# Patient Record
Sex: Male | Born: 1950
Health system: Southern US, Community
[De-identification: ages and names within clinical notes are randomized; demographics above are authoritative.]

## PROBLEM LIST (undated history)

## (undated) DIAGNOSIS — G4733 Obstructive sleep apnea (adult) (pediatric): Secondary | ICD-10-CM

## (undated) DIAGNOSIS — I10 Essential (primary) hypertension: Secondary | ICD-10-CM

## (undated) DIAGNOSIS — Z87440 Personal history of urinary (tract) infections: Secondary | ICD-10-CM

## (undated) DIAGNOSIS — Z87442 Personal history of urinary calculi: Secondary | ICD-10-CM

## (undated) DIAGNOSIS — K219 Gastro-esophageal reflux disease without esophagitis: Secondary | ICD-10-CM

## (undated) HISTORY — PX: OTHER SURGICAL HISTORY: SHX169

## (undated) HISTORY — DX: Obstructive sleep apnea (adult) (pediatric): G47.33

---

## 1999-06-04 ENCOUNTER — Encounter: Payer: Self-pay | Admitting: Emergency Medicine

## 1999-06-04 ENCOUNTER — Emergency Department (HOSPITAL_COMMUNITY): Admission: EM | Admit: 1999-06-04 | Discharge: 1999-06-04 | Payer: Self-pay | Admitting: Emergency Medicine

## 2012-08-20 DIAGNOSIS — K219 Gastro-esophageal reflux disease without esophagitis: Secondary | ICD-10-CM | POA: Diagnosis present

## 2013-02-22 DIAGNOSIS — N401 Enlarged prostate with lower urinary tract symptoms: Secondary | ICD-10-CM | POA: Diagnosis present

## 2016-12-09 ENCOUNTER — Other Ambulatory Visit: Payer: Self-pay | Admitting: Urology

## 2016-12-10 ENCOUNTER — Encounter (HOSPITAL_COMMUNITY): Payer: Self-pay | Admitting: *Deleted

## 2016-12-10 NOTE — Progress Notes (Signed)
Attempted to contact pt per telephone number of [phone]; pt not available at this number till after 7pm; gentleman answering phone gave second number of [phone]; no answer but LVMM with return call back number of [phone].

## 2016-12-12 ENCOUNTER — Ambulatory Visit (HOSPITAL_COMMUNITY)
Admission: RE | Admit: 2016-12-12 | Discharge: 2016-12-12 | Disposition: A | Discharge status: Home / Self Care | Payer: Commercial Managed Care - HMO | Source: Ambulatory Visit | Attending: Urology | Admitting: Urology

## 2016-12-12 ENCOUNTER — Ambulatory Visit (HOSPITAL_COMMUNITY): Payer: Commercial Managed Care - HMO

## 2016-12-12 ENCOUNTER — Encounter (HOSPITAL_COMMUNITY): Payer: Self-pay | Admitting: General Practice

## 2016-12-12 ENCOUNTER — Encounter (HOSPITAL_COMMUNITY)
Admission: RE | Disposition: A | Discharge status: Home / Self Care | Payer: Self-pay | Source: Ambulatory Visit | Attending: Urology

## 2016-12-12 DIAGNOSIS — Z7982 Long term (current) use of aspirin: Secondary | ICD-10-CM | POA: Insufficient documentation

## 2016-12-12 DIAGNOSIS — I10 Essential (primary) hypertension: Secondary | ICD-10-CM | POA: Insufficient documentation

## 2016-12-12 DIAGNOSIS — N201 Calculus of ureter: Secondary | ICD-10-CM | POA: Diagnosis present

## 2016-12-12 HISTORY — DX: Essential (primary) hypertension: I10

## 2016-12-12 HISTORY — DX: Gastro-esophageal reflux disease without esophagitis: K21.9

## 2016-12-12 HISTORY — PX: EXTRACORPOREAL SHOCK WAVE LITHOTRIPSY: SHX1557

## 2016-12-12 HISTORY — DX: Personal history of urinary calculi: Z87.442

## 2016-12-12 HISTORY — DX: Personal history of urinary (tract) infections: Z87.440

## 2016-12-12 SURGERY — LITHOTRIPSY, ESWL
Anesthesia: LOCAL | Laterality: Right

## 2016-12-12 MED ORDER — DIAZEPAM 5 MG PO TABS
10.0000 mg | ORAL_TABLET | ORAL | Status: AC
  Administered 2016-12-12: 10 mg via ORAL
  Filled 2016-12-12: qty 2

## 2016-12-12 MED ORDER — CIPROFLOXACIN HCL 500 MG PO TABS
500.0000 mg | ORAL_TABLET | ORAL | Status: AC
  Administered 2016-12-12: 500 mg via ORAL
  Filled 2016-12-12: qty 1

## 2016-12-12 MED ORDER — DIPHENHYDRAMINE HCL 25 MG PO CAPS
25.0000 mg | ORAL_CAPSULE | ORAL | Status: AC
  Administered 2016-12-12: 25 mg via ORAL
  Filled 2016-12-12: qty 1

## 2016-12-12 MED ORDER — SODIUM CHLORIDE 0.9 % IV SOLN
INTRAVENOUS | Status: DC
  Administered 2016-12-12: 07:00:00 via INTRAVENOUS

## 2016-12-12 MED FILL — OXYCODONE/APAP 5/325 MG TAB: 5-325 | 5 days supply | Qty: 30 | Fill #0

## 2016-12-12 MED FILL — TAMSULOSIN HCL 0.4 MG CAP: 0.4 | 30 days supply | Qty: 30 | Fill #0

## 2016-12-12 NOTE — Progress Notes (Deleted)
Dr. Alyson Ingles notified of pt. Taking aspirin on 12/09/16 at 2200 , Dr. Alyson Ingles reviewed  KUB. Procedure cancelled, Lithotripsy truck  notified, pt. To be rescheduled through the office. Pt. Notified when procedure rescheduled to make sure he does not take aspirin for 3 days prior. Pt. And pt's wife verbalized understanding.

## 2016-12-13 ENCOUNTER — Encounter (HOSPITAL_COMMUNITY): Payer: Self-pay | Admitting: Urology

## 2017-08-12 ENCOUNTER — Other Ambulatory Visit: Payer: Self-pay | Admitting: Urology

## 2017-08-12 DIAGNOSIS — R972 Elevated prostate specific antigen [PSA]: Secondary | ICD-10-CM

## 2017-08-26 ENCOUNTER — Other Ambulatory Visit: Payer: Commercial Managed Care - HMO

## 2017-09-15 ENCOUNTER — Ambulatory Visit
Admission: RE | Admit: 2017-09-15 | Discharge: 2017-09-15 | Disposition: A | Discharge status: Home / Self Care | Payer: Commercial Managed Care - HMO | Source: Ambulatory Visit | Attending: Urology | Admitting: Urology

## 2017-09-15 DIAGNOSIS — R972 Elevated prostate specific antigen [PSA]: Secondary | ICD-10-CM

## 2017-09-15 MED ORDER — GADOBENATE DIMEGLUMINE 529 MG/ML IV SOLN
15.0000 mL | Freq: Once | INTRAVENOUS | Status: AC | PRN
  Administered 2017-09-15: 15 mL via INTRAVENOUS

## 2017-11-16 ENCOUNTER — Ambulatory Visit (INDEPENDENT_AMBULATORY_CARE_PROVIDER_SITE_OTHER): Payer: Commercial Managed Care - HMO | Admitting: Ophthalmology

## 2017-11-16 ENCOUNTER — Encounter (INDEPENDENT_AMBULATORY_CARE_PROVIDER_SITE_OTHER): Payer: Self-pay | Admitting: Ophthalmology

## 2017-11-16 DIAGNOSIS — H353131 Nonexudative age-related macular degeneration, bilateral, early dry stage: Secondary | ICD-10-CM | POA: Diagnosis not present

## 2017-11-16 DIAGNOSIS — Z961 Presence of intraocular lens: Secondary | ICD-10-CM | POA: Diagnosis not present

## 2017-11-16 DIAGNOSIS — T8522XA Displacement of intraocular lens, initial encounter: Secondary | ICD-10-CM

## 2017-11-16 NOTE — Progress Notes (Signed)
[facility] Note  11/16/2017     CHIEF COMPLAINT Patient presents for Retina Evaluation and Blurred Vision   HISTORY OF PRESENT ILLNESS: Christopher Castillo is a 67 y.o. male who presents to the clinic today for:   HPI    Retina Evaluation    In right eye.  This started 1 day ago.  Duration of 1 day.  Associated Symptoms Negative for Flashes, Floaters, Redness, Photophobia and Trauma.  Treatments tried include no treatments.  I, the attending physician,  performed the HPI with the patient and updated documentation appropriately.          Blurred Vision    In right eye.  Onset was sudden.  Vision is blurred and hazy.  Severity is moderate.  This started 1 day ago.  Occurring constantly.  Since onset it is stable.  Associated symptoms include haloes.  Negative for eye pain, pain with eye movement, trauma and scalp tenderness.  Treatments tried include no treatments.  I, the attending physician,  performed the HPI with the patient and updated documentation appropriately.          Comments    1 day history of decreased vision OD. Pt is s/p phaco/PCIOL OU w/ Dr. Talbert Forest: OS on 2.4.19, OD on 2.25.19. Pt reports non standard intra-op course--?post capsule rupture w/ sulcus IOL placement. Pt reports normal course for first couple of days with improved vision, then noted on Saturday evening that OD was blurry w/ halos. States he is able to see gross objects but unable to make out any details. Denies eye pain, redness.  Currently taking: besivance TID OD, durezol TID OD, prolensa qdaily OD. Post op drops OS completed.       Last edited by Bernarda Caffey, MD on 11/16/2017  2:18 PM. (History)      Referring physician: Darleen Crocker, MD [address] [place], [place] [address]  HISTORICAL INFORMATION:   Selected notes from the [place]: No current outpatient medications on file. (Ophthalmic Drugs)   No current  facility-administered medications for this visit.  (Ophthalmic Drugs)   Current Outpatient Medications (Other)  Medication Sig  . aspirin EC 81 MG tablet Take 81 mg by mouth daily.  Marland Kitchen atenolol (TENORMIN) 50 MG tablet Take 50 mg by mouth daily.  . fenofibrate (TRICOR) 145 MG tablet Take 145 mg by mouth daily.  Marland Kitchen lisinopril (PRINIVIL,ZESTRIL) 40 MG tablet Take 40 mg by mouth daily.  Marland Kitchen omeprazole (PRILOSEC) 20 MG capsule Take 20 mg by mouth daily.   No current facility-administered medications for this visit.  (Other)      REVIEW OF SYSTEMS: ROS    Positive for: Cardiovascular, Eyes   Negative for: Constitutional, Gastrointestinal, Neurological, Skin, Genitourinary, Musculoskeletal, HENT, Endocrine, Respiratory, Psychiatric, Allergic/Imm, Heme/Lymph   Last edited by Bernarda Caffey, MD on 11/16/2017  2:18 PM. (History)       ALLERGIES No Known Allergies  PAST MEDICAL HISTORY Past Medical History:  Diagnosis Date  . GERD (gastroesophageal reflux disease)   . History of kidney stones   . History of urinary tract infection   . Hypertension    Past Surgical History:  Procedure Laterality Date  . colonscopy    . EXTRACORPOREAL SHOCK WAVE LITHOTRIPSY Right 12/12/2016   Procedure: RIGHT EXTRACORPOREAL SHOCK WAVE LITHOTRIPSY (ESWL);  Surgeon: Cleon Gustin, MD;  Location: [facility];  Service: Urology;  Laterality: Right;    FAMILY HISTORY History reviewed.  No pertinent family history.  SOCIAL HISTORY Social History   Tobacco Use  . Smoking status: Never Smoker  . Smokeless tobacco: Never Used  Substance Use Topics  . Alcohol use: No  . Drug use: No         OPHTHALMIC EXAM:  Base Eye Exam    Visual Acuity (Snellen - Linear)      Right Left   Dist [place] 20/200 +1 20/20 -1   Dist ph [place] 20/20 -2        Tonometry (Tonopen, 2:12 PM)      Right Left   Pressure 22 17       Pupils      Dark Light Shape React APD   Right 3.5 3 slightly irreg 2+ None   Left 3.5 3 Round  2+ None       Extraocular Movement      Right Left    Full Full       Neuro/Psych    Oriented x3:  Yes   Mood/Affect:  Normal       Dilation    Both eyes:  1.0% Mydriacyl, 2.5% Phenylephrine @ 2:18 PM        Slit Lamp and Fundus Exam    Slit Lamp Exam      Right Left   Lids/Lashes Normal Normal   Conjunctiva/Sclera White and quiet White and quiet   Cornea well healed temporal cataract wound with mild microcystic edema Clear   Anterior Chamber 2-3+ cell and pigment Deep and quiet   Iris temporal TID; slightly irregular temporal pupil margin Round and reactive   Lens 3 piece sulcus IOL-- shifted slightly nasal and nasal optic tilted anteriorly. PCIOL in good position   Vitreous syneresis syneresis       Fundus Exam      Right Left   Disc Normal Normal   C/D Ratio 0.55 0.5   Macula flat; pigment mottling and clumping flat; mild RPE mottling   Vessels Normal Normal   Periphery attached; cobblestoning; reticular degen and peripheral drusen attached; cobblestoning; reticular degen and peripheral drusen          IMAGING AND PROCEDURES  Imaging and Procedures for 11/16/17           ASSESSMENT/PLAN:    ICD-10-CM   1. Displacement of intraocular lens, initial encounter T85.22XA   2. Pseudophakia of both eyes Z96.1   3. Early dry stage nonexudative age-related macular degeneration of both eyes H35.3131     1. Tilted IOL OD  - s/p CE w/ 3-piece IOL in sulcus on 2.25.19 (Dr. Talbert Forest)  - IOL slightly shifted nasally and nasal optic tilted anteriorly, but not in AC  - VA 20/200, pinholes to 20/20 OD  - mild temporal TID and 2-3+ AC pigment and cell  - IOP okay at 22 OD  - discussed case with Dr. Talbert Forest on phone  - discussed findings, prognosis, and treatment options w/ patient  - 1 drop of pilocarpine 2% placed OD to constrict pupil back down  - pt to see Dr. Talbert Forest tomorrow at 4 pm   2. Pseudophakia OU  - s/p CE/IOL OU ([place])  - OS on 2.4.19  - OD on  2.25.19  3. Age related macular degeneration, non-exudative, both eyes  - The incidence, anatomy, and pathology of dry AMD, risk of progression, and the AREDS and AREDS 2 study including smoking risks discussed with patient.  - Recommend amsler grid monitoring   Ophthalmic Meds Ordered this  visit:  No orders of the defined types were placed in this encounter.      Return if symptoms worsen or fail to improve.  There are no Patient Instructions on file for this visit.   Explained the diagnoses, plan, and follow up with the patient and they expressed understanding.  Patient expressed understanding of the importance of proper follow up care.   Gardiner Sleeper, M.D., Ph.D. Diseases & Surgery of the Retina and [place] 11/16/17     Abbreviations: M myopia (nearsighted); A astigmatism; H hyperopia (farsighted); P presbyopia; Mrx spectacle prescription;  CTL contact lenses; OD right eye; OS left eye; OU both eyes  XT exotropia; ET esotropia; PEK punctate epithelial keratitis; PEE punctate epithelial erosions; DES dry eye syndrome; MGD meibomian gland dysfunction; ATs artificial tears; PFAT's preservative free artificial tears; [place] nuclear sclerotic cataract; PSC posterior subcapsular cataract; ERM epi-retinal membrane; PVD posterior vitreous detachment; RD retinal detachment; DM diabetes mellitus; DR diabetic retinopathy; NPDR non-proliferative diabetic retinopathy; PDR proliferative diabetic retinopathy; CSME clinically significant macular edema; DME diabetic macular edema; dbh dot blot hemorrhages; CWS cotton wool spot; POAG primary open angle glaucoma; C/D cup-to-disc ratio; HVF humphrey visual field; GVF goldmann visual field; OCT optical coherence tomography; IOP intraocular pressure; BRVO Branch retinal vein occlusion; CRVO central retinal vein occlusion; CRAO central retinal artery occlusion; BRAO branch retinal artery occlusion; RT retinal tear; SB  scleral buckle; PPV pars plana vitrectomy; VH Vitreous hemorrhage; PRP panretinal laser photocoagulation; IVK intravitreal kenalog; VMT vitreomacular traction; MH Macular hole;  NVD neovascularization of the disc; NVE neovascularization elsewhere; AREDS age related eye disease study; ARMD age related macular degeneration; POAG primary open angle glaucoma; EBMD epithelial/anterior basement membrane dystrophy; ACIOL anterior chamber intraocular lens; IOL intraocular lens; PCIOL posterior chamber intraocular lens; Phaco/IOL phacoemulsification with intraocular lens placement; [place] photorefractive keratectomy; LASIK laser assisted in situ keratomileusis; HTN hypertension; DM diabetes mellitus; COPD chronic obstructive pulmonary disease

## 2019-02-04 DIAGNOSIS — R972 Elevated prostate specific antigen [PSA]: Secondary | ICD-10-CM | POA: Diagnosis not present

## 2019-02-11 DIAGNOSIS — R972 Elevated prostate specific antigen [PSA]: Secondary | ICD-10-CM | POA: Diagnosis not present

## 2019-02-11 DIAGNOSIS — R351 Nocturia: Secondary | ICD-10-CM | POA: Diagnosis not present

## 2019-02-11 DIAGNOSIS — N401 Enlarged prostate with lower urinary tract symptoms: Secondary | ICD-10-CM | POA: Diagnosis not present

## 2019-02-18 DIAGNOSIS — H9202 Otalgia, left ear: Secondary | ICD-10-CM | POA: Diagnosis not present

## 2019-02-18 DIAGNOSIS — H9191 Unspecified hearing loss, right ear: Secondary | ICD-10-CM | POA: Diagnosis not present

## 2019-02-18 DIAGNOSIS — H90A32 Mixed conductive and sensorineural hearing loss, unilateral, left ear with restricted hearing on the contralateral side: Secondary | ICD-10-CM | POA: Diagnosis not present

## 2019-02-18 DIAGNOSIS — H9012 Conductive hearing loss, unilateral, left ear, with unrestricted hearing on the contralateral side: Secondary | ICD-10-CM | POA: Diagnosis not present

## 2019-03-23 DIAGNOSIS — H9202 Otalgia, left ear: Secondary | ICD-10-CM | POA: Diagnosis not present

## 2019-03-23 DIAGNOSIS — H9012 Conductive hearing loss, unilateral, left ear, with unrestricted hearing on the contralateral side: Secondary | ICD-10-CM | POA: Diagnosis not present

## 2019-03-23 DIAGNOSIS — H900 Conductive hearing loss, bilateral: Secondary | ICD-10-CM | POA: Diagnosis not present

## 2019-04-28 DIAGNOSIS — H5340 Unspecified visual field defects: Secondary | ICD-10-CM | POA: Diagnosis not present

## 2019-04-28 DIAGNOSIS — H21231 Degeneration of iris (pigmentary), right eye: Secondary | ICD-10-CM | POA: Diagnosis not present

## 2019-04-28 DIAGNOSIS — H40051 Ocular hypertension, right eye: Secondary | ICD-10-CM | POA: Diagnosis not present

## 2019-05-13 DIAGNOSIS — L570 Actinic keratosis: Secondary | ICD-10-CM | POA: Diagnosis not present

## 2019-05-13 DIAGNOSIS — D485 Neoplasm of uncertain behavior of skin: Secondary | ICD-10-CM | POA: Diagnosis not present

## 2019-05-13 DIAGNOSIS — L814 Other melanin hyperpigmentation: Secondary | ICD-10-CM | POA: Diagnosis not present

## 2019-05-13 DIAGNOSIS — D229 Melanocytic nevi, unspecified: Secondary | ICD-10-CM | POA: Diagnosis not present

## 2019-05-13 DIAGNOSIS — B078 Other viral warts: Secondary | ICD-10-CM | POA: Diagnosis not present

## 2019-05-13 DIAGNOSIS — L819 Disorder of pigmentation, unspecified: Secondary | ICD-10-CM | POA: Diagnosis not present

## 2019-05-13 DIAGNOSIS — D1801 Hemangioma of skin and subcutaneous tissue: Secondary | ICD-10-CM | POA: Diagnosis not present

## 2019-05-13 DIAGNOSIS — L821 Other seborrheic keratosis: Secondary | ICD-10-CM | POA: Diagnosis not present

## 2019-05-14 DIAGNOSIS — L7211 Pilar cyst: Secondary | ICD-10-CM | POA: Diagnosis not present

## 2019-06-29 DIAGNOSIS — H40013 Open angle with borderline findings, low risk, bilateral: Secondary | ICD-10-CM | POA: Diagnosis not present

## 2019-09-22 DIAGNOSIS — H34832 Tributary (branch) retinal vein occlusion, left eye, with macular edema: Secondary | ICD-10-CM | POA: Diagnosis not present

## 2019-09-23 DIAGNOSIS — H35033 Hypertensive retinopathy, bilateral: Secondary | ICD-10-CM | POA: Diagnosis not present

## 2019-09-23 DIAGNOSIS — H43813 Vitreous degeneration, bilateral: Secondary | ICD-10-CM | POA: Diagnosis not present

## 2019-09-23 DIAGNOSIS — H34832 Tributary (branch) retinal vein occlusion, left eye, with macular edema: Secondary | ICD-10-CM | POA: Diagnosis not present

## 2019-09-23 DIAGNOSIS — H35361 Drusen (degenerative) of macula, right eye: Secondary | ICD-10-CM | POA: Diagnosis not present

## 2019-09-30 DIAGNOSIS — H34832 Tributary (branch) retinal vein occlusion, left eye, with macular edema: Secondary | ICD-10-CM | POA: Diagnosis not present

## 2019-10-25 ENCOUNTER — Ambulatory Visit: Payer: PPO | Attending: Internal Medicine

## 2019-10-25 DIAGNOSIS — Z23 Encounter for immunization: Secondary | ICD-10-CM | POA: Insufficient documentation

## 2019-10-25 NOTE — Progress Notes (Signed)
   Covid-19 Vaccination Clinic  Name:  Christopher Castillo    MRN: [id number] DOB: 08-28-1951  10/25/2019  Mr. Christopher Castillo was observed post Covid-19 immunization for 15 minutes without incidence. He was provided with Vaccine Information Sheet and instruction to access the V-Safe system.   Christopher Castillo was instructed to call 911 with any severe reactions post vaccine: Marland Kitchen Difficulty breathing  . Swelling of your face and throat  . A fast heartbeat  . A bad rash all over your body  . Dizziness and weakness    Immunizations Administered    Name Date Dose VIS Date Route   Pfizer COVID-19 Vaccine 10/25/2019 11:43 AM 0.3 mL 08/27/2019 Intramuscular   Manufacturer: [place]   Lot: [id number]   [place]: [id number]

## 2019-10-28 DIAGNOSIS — H35361 Drusen (degenerative) of macula, right eye: Secondary | ICD-10-CM | POA: Diagnosis not present

## 2019-10-28 DIAGNOSIS — H35033 Hypertensive retinopathy, bilateral: Secondary | ICD-10-CM | POA: Diagnosis not present

## 2019-10-28 DIAGNOSIS — H43813 Vitreous degeneration, bilateral: Secondary | ICD-10-CM | POA: Diagnosis not present

## 2019-10-28 DIAGNOSIS — H34832 Tributary (branch) retinal vein occlusion, left eye, with macular edema: Secondary | ICD-10-CM | POA: Diagnosis not present

## 2019-11-11 ENCOUNTER — Ambulatory Visit: Payer: Commercial Managed Care - HMO

## 2019-11-16 DIAGNOSIS — L570 Actinic keratosis: Secondary | ICD-10-CM | POA: Diagnosis not present

## 2019-11-16 DIAGNOSIS — L819 Disorder of pigmentation, unspecified: Secondary | ICD-10-CM | POA: Diagnosis not present

## 2019-11-16 DIAGNOSIS — D485 Neoplasm of uncertain behavior of skin: Secondary | ICD-10-CM | POA: Diagnosis not present

## 2019-11-18 ENCOUNTER — Ambulatory Visit: Payer: PPO | Attending: Internal Medicine

## 2019-11-18 DIAGNOSIS — Z23 Encounter for immunization: Secondary | ICD-10-CM

## 2019-11-18 NOTE — Progress Notes (Signed)
   Covid-19 Vaccination Clinic  Name:  Christopher Castillo    MRN: [id number] DOB: Nov 22, 1950  11/18/2019  [name] was observed post Covid-19 immunization for 15 minutes without incident. He was provided with Vaccine Information Sheet and instruction to access the V-Safe system.   [name] was instructed to call 911 with any severe reactions post vaccine: Marland Kitchen Difficulty breathing  . Swelling of face and throat  . A fast heartbeat  . A bad rash all over body  . Dizziness and weakness   Immunizations Administered    Name Date Dose VIS Date Route   Pfizer COVID-19 Vaccine 11/18/2019  9:43 AM 0.3 mL 08/27/2019 Intramuscular   Manufacturer: [place]   Lot: [id number]   [place]: [id number]

## 2019-11-23 DIAGNOSIS — Z125 Encounter for screening for malignant neoplasm of prostate: Secondary | ICD-10-CM | POA: Diagnosis not present

## 2019-11-23 DIAGNOSIS — Z0000 Encounter for general adult medical examination without abnormal findings: Secondary | ICD-10-CM | POA: Diagnosis not present

## 2019-11-23 DIAGNOSIS — E781 Pure hyperglyceridemia: Secondary | ICD-10-CM | POA: Diagnosis not present

## 2019-11-26 DIAGNOSIS — N401 Enlarged prostate with lower urinary tract symptoms: Secondary | ICD-10-CM | POA: Diagnosis not present

## 2019-11-26 DIAGNOSIS — J309 Allergic rhinitis, unspecified: Secondary | ICD-10-CM | POA: Diagnosis not present

## 2019-11-26 DIAGNOSIS — Z1339 Encounter for screening examination for other mental health and behavioral disorders: Secondary | ICD-10-CM | POA: Diagnosis not present

## 2019-11-26 DIAGNOSIS — H3320 Serous retinal detachment, unspecified eye: Secondary | ICD-10-CM | POA: Diagnosis not present

## 2019-11-26 DIAGNOSIS — I10 Essential (primary) hypertension: Secondary | ICD-10-CM | POA: Diagnosis not present

## 2019-11-26 DIAGNOSIS — K219 Gastro-esophageal reflux disease without esophagitis: Secondary | ICD-10-CM | POA: Diagnosis not present

## 2019-11-26 DIAGNOSIS — Z1331 Encounter for screening for depression: Secondary | ICD-10-CM | POA: Diagnosis not present

## 2019-11-26 DIAGNOSIS — R82998 Other abnormal findings in urine: Secondary | ICD-10-CM | POA: Diagnosis not present

## 2019-11-26 DIAGNOSIS — Z0000 Encounter for general adult medical examination without abnormal findings: Secondary | ICD-10-CM | POA: Diagnosis not present

## 2019-11-26 DIAGNOSIS — E781 Pure hyperglyceridemia: Secondary | ICD-10-CM | POA: Diagnosis not present

## 2019-11-26 DIAGNOSIS — F325 Major depressive disorder, single episode, in full remission: Secondary | ICD-10-CM | POA: Diagnosis not present

## 2019-12-06 DIAGNOSIS — D485 Neoplasm of uncertain behavior of skin: Secondary | ICD-10-CM | POA: Diagnosis not present

## 2019-12-10 DIAGNOSIS — Z1212 Encounter for screening for malignant neoplasm of rectum: Secondary | ICD-10-CM | POA: Diagnosis not present

## 2019-12-15 DIAGNOSIS — H34832 Tributary (branch) retinal vein occlusion, left eye, with macular edema: Secondary | ICD-10-CM | POA: Diagnosis not present

## 2019-12-15 DIAGNOSIS — H43813 Vitreous degeneration, bilateral: Secondary | ICD-10-CM | POA: Diagnosis not present

## 2019-12-15 DIAGNOSIS — H35033 Hypertensive retinopathy, bilateral: Secondary | ICD-10-CM | POA: Diagnosis not present

## 2019-12-15 DIAGNOSIS — H35361 Drusen (degenerative) of macula, right eye: Secondary | ICD-10-CM | POA: Diagnosis not present

## 2019-12-20 DIAGNOSIS — H903 Sensorineural hearing loss, bilateral: Secondary | ICD-10-CM | POA: Diagnosis not present

## 2019-12-28 DIAGNOSIS — H903 Sensorineural hearing loss, bilateral: Secondary | ICD-10-CM | POA: Diagnosis not present

## 2020-02-25 ENCOUNTER — Other Ambulatory Visit: Payer: Self-pay | Admitting: Urology

## 2020-03-01 DIAGNOSIS — H35361 Drusen (degenerative) of macula, right eye: Secondary | ICD-10-CM | POA: Diagnosis not present

## 2020-03-01 DIAGNOSIS — H34832 Tributary (branch) retinal vein occlusion, left eye, with macular edema: Secondary | ICD-10-CM | POA: Diagnosis not present

## 2020-03-01 DIAGNOSIS — H43813 Vitreous degeneration, bilateral: Secondary | ICD-10-CM | POA: Diagnosis not present

## 2020-03-01 DIAGNOSIS — H35033 Hypertensive retinopathy, bilateral: Secondary | ICD-10-CM | POA: Diagnosis not present

## 2020-03-03 DIAGNOSIS — R972 Elevated prostate specific antigen [PSA]: Secondary | ICD-10-CM | POA: Diagnosis not present

## 2020-03-08 DIAGNOSIS — R351 Nocturia: Secondary | ICD-10-CM | POA: Diagnosis not present

## 2020-03-08 DIAGNOSIS — R972 Elevated prostate specific antigen [PSA]: Secondary | ICD-10-CM | POA: Diagnosis not present

## 2020-03-08 DIAGNOSIS — N401 Enlarged prostate with lower urinary tract symptoms: Secondary | ICD-10-CM | POA: Diagnosis not present

## 2020-03-29 ENCOUNTER — Other Ambulatory Visit: Payer: Self-pay | Admitting: Urology

## 2020-04-24 DIAGNOSIS — Z20828 Contact with and (suspected) exposure to other viral communicable diseases: Secondary | ICD-10-CM | POA: Diagnosis not present

## 2020-05-24 DIAGNOSIS — H34832 Tributary (branch) retinal vein occlusion, left eye, with macular edema: Secondary | ICD-10-CM | POA: Diagnosis not present

## 2020-05-24 DIAGNOSIS — H35361 Drusen (degenerative) of macula, right eye: Secondary | ICD-10-CM | POA: Diagnosis not present

## 2020-05-24 DIAGNOSIS — H35033 Hypertensive retinopathy, bilateral: Secondary | ICD-10-CM | POA: Diagnosis not present

## 2020-05-24 DIAGNOSIS — H43813 Vitreous degeneration, bilateral: Secondary | ICD-10-CM | POA: Diagnosis not present

## 2020-08-16 DIAGNOSIS — H34832 Tributary (branch) retinal vein occlusion, left eye, with macular edema: Secondary | ICD-10-CM | POA: Diagnosis not present

## 2020-11-13 DIAGNOSIS — H35361 Drusen (degenerative) of macula, right eye: Secondary | ICD-10-CM | POA: Diagnosis not present

## 2020-11-13 DIAGNOSIS — H34832 Tributary (branch) retinal vein occlusion, left eye, with macular edema: Secondary | ICD-10-CM | POA: Diagnosis not present

## 2020-11-13 DIAGNOSIS — H43813 Vitreous degeneration, bilateral: Secondary | ICD-10-CM | POA: Diagnosis not present

## 2020-11-13 DIAGNOSIS — H35033 Hypertensive retinopathy, bilateral: Secondary | ICD-10-CM | POA: Diagnosis not present

## 2020-11-27 DIAGNOSIS — Z125 Encounter for screening for malignant neoplasm of prostate: Secondary | ICD-10-CM | POA: Diagnosis not present

## 2020-11-27 DIAGNOSIS — E781 Pure hyperglyceridemia: Secondary | ICD-10-CM | POA: Diagnosis not present

## 2020-11-27 DIAGNOSIS — I10 Essential (primary) hypertension: Secondary | ICD-10-CM | POA: Diagnosis not present

## 2020-12-04 DIAGNOSIS — Z0000 Encounter for general adult medical examination without abnormal findings: Secondary | ICD-10-CM | POA: Diagnosis not present

## 2020-12-04 DIAGNOSIS — N401 Enlarged prostate with lower urinary tract symptoms: Secondary | ICD-10-CM | POA: Diagnosis not present

## 2020-12-04 DIAGNOSIS — I10 Essential (primary) hypertension: Secondary | ICD-10-CM | POA: Diagnosis not present

## 2020-12-04 DIAGNOSIS — Z1339 Encounter for screening examination for other mental health and behavioral disorders: Secondary | ICD-10-CM | POA: Diagnosis not present

## 2020-12-04 DIAGNOSIS — R82998 Other abnormal findings in urine: Secondary | ICD-10-CM | POA: Diagnosis not present

## 2020-12-04 DIAGNOSIS — Z1331 Encounter for screening for depression: Secondary | ICD-10-CM | POA: Diagnosis not present

## 2020-12-04 DIAGNOSIS — Z1212 Encounter for screening for malignant neoplasm of rectum: Secondary | ICD-10-CM | POA: Diagnosis not present

## 2020-12-04 DIAGNOSIS — K219 Gastro-esophageal reflux disease without esophagitis: Secondary | ICD-10-CM | POA: Diagnosis not present

## 2020-12-04 DIAGNOSIS — E781 Pure hyperglyceridemia: Secondary | ICD-10-CM | POA: Diagnosis not present

## 2020-12-04 DIAGNOSIS — E663 Overweight: Secondary | ICD-10-CM | POA: Diagnosis not present

## 2020-12-04 DIAGNOSIS — R972 Elevated prostate specific antigen [PSA]: Secondary | ICD-10-CM | POA: Diagnosis not present

## 2020-12-04 DIAGNOSIS — F325 Major depressive disorder, single episode, in full remission: Secondary | ICD-10-CM | POA: Diagnosis not present

## 2020-12-04 DIAGNOSIS — L570 Actinic keratosis: Secondary | ICD-10-CM | POA: Diagnosis not present

## 2021-01-08 DIAGNOSIS — L989 Disorder of the skin and subcutaneous tissue, unspecified: Secondary | ICD-10-CM | POA: Diagnosis not present

## 2021-01-08 DIAGNOSIS — L821 Other seborrheic keratosis: Secondary | ICD-10-CM | POA: Diagnosis not present

## 2021-01-08 DIAGNOSIS — L819 Disorder of pigmentation, unspecified: Secondary | ICD-10-CM | POA: Diagnosis not present

## 2021-01-08 DIAGNOSIS — D229 Melanocytic nevi, unspecified: Secondary | ICD-10-CM | POA: Diagnosis not present

## 2021-01-08 DIAGNOSIS — D485 Neoplasm of uncertain behavior of skin: Secondary | ICD-10-CM | POA: Diagnosis not present

## 2021-01-08 DIAGNOSIS — L570 Actinic keratosis: Secondary | ICD-10-CM | POA: Diagnosis not present

## 2021-01-08 DIAGNOSIS — L718 Other rosacea: Secondary | ICD-10-CM | POA: Diagnosis not present

## 2021-01-08 DIAGNOSIS — L814 Other melanin hyperpigmentation: Secondary | ICD-10-CM | POA: Diagnosis not present

## 2021-01-22 DIAGNOSIS — H35033 Hypertensive retinopathy, bilateral: Secondary | ICD-10-CM | POA: Diagnosis not present

## 2021-01-22 DIAGNOSIS — H35361 Drusen (degenerative) of macula, right eye: Secondary | ICD-10-CM | POA: Diagnosis not present

## 2021-01-22 DIAGNOSIS — H43813 Vitreous degeneration, bilateral: Secondary | ICD-10-CM | POA: Diagnosis not present

## 2021-01-22 DIAGNOSIS — H34832 Tributary (branch) retinal vein occlusion, left eye, with macular edema: Secondary | ICD-10-CM | POA: Diagnosis not present

## 2021-02-23 DIAGNOSIS — D120 Benign neoplasm of cecum: Secondary | ICD-10-CM | POA: Diagnosis not present

## 2021-02-23 DIAGNOSIS — D124 Benign neoplasm of descending colon: Secondary | ICD-10-CM | POA: Diagnosis not present

## 2021-02-23 DIAGNOSIS — K635 Polyp of colon: Secondary | ICD-10-CM | POA: Diagnosis not present

## 2021-02-23 DIAGNOSIS — Z1211 Encounter for screening for malignant neoplasm of colon: Secondary | ICD-10-CM | POA: Diagnosis not present

## 2021-03-05 DIAGNOSIS — R972 Elevated prostate specific antigen [PSA]: Secondary | ICD-10-CM | POA: Diagnosis not present

## 2021-03-12 DIAGNOSIS — R351 Nocturia: Secondary | ICD-10-CM | POA: Diagnosis not present

## 2021-03-12 DIAGNOSIS — N401 Enlarged prostate with lower urinary tract symptoms: Secondary | ICD-10-CM | POA: Diagnosis not present

## 2021-03-12 DIAGNOSIS — R972 Elevated prostate specific antigen [PSA]: Secondary | ICD-10-CM | POA: Diagnosis not present

## 2021-03-26 DIAGNOSIS — H34832 Tributary (branch) retinal vein occlusion, left eye, with macular edema: Secondary | ICD-10-CM | POA: Diagnosis not present

## 2021-03-26 DIAGNOSIS — H35033 Hypertensive retinopathy, bilateral: Secondary | ICD-10-CM | POA: Diagnosis not present

## 2021-03-26 DIAGNOSIS — H35361 Drusen (degenerative) of macula, right eye: Secondary | ICD-10-CM | POA: Diagnosis not present

## 2021-03-26 DIAGNOSIS — H43813 Vitreous degeneration, bilateral: Secondary | ICD-10-CM | POA: Diagnosis not present

## 2021-04-11 DIAGNOSIS — H40013 Open angle with borderline findings, low risk, bilateral: Secondary | ICD-10-CM | POA: Diagnosis not present

## 2021-05-28 DIAGNOSIS — H35361 Drusen (degenerative) of macula, right eye: Secondary | ICD-10-CM | POA: Diagnosis not present

## 2021-05-28 DIAGNOSIS — H34832 Tributary (branch) retinal vein occlusion, left eye, with macular edema: Secondary | ICD-10-CM | POA: Diagnosis not present

## 2021-05-31 DIAGNOSIS — H18413 Arcus senilis, bilateral: Secondary | ICD-10-CM | POA: Diagnosis not present

## 2021-05-31 DIAGNOSIS — H26492 Other secondary cataract, left eye: Secondary | ICD-10-CM | POA: Diagnosis not present

## 2021-05-31 DIAGNOSIS — Z961 Presence of intraocular lens: Secondary | ICD-10-CM | POA: Diagnosis not present

## 2021-05-31 DIAGNOSIS — H348322 Tributary (branch) retinal vein occlusion, left eye, stable: Secondary | ICD-10-CM | POA: Diagnosis not present

## 2021-06-07 DIAGNOSIS — Z9842 Cataract extraction status, left eye: Secondary | ICD-10-CM | POA: Diagnosis not present

## 2021-07-10 DIAGNOSIS — L812 Freckles: Secondary | ICD-10-CM | POA: Diagnosis not present

## 2021-07-10 DIAGNOSIS — L819 Disorder of pigmentation, unspecified: Secondary | ICD-10-CM | POA: Diagnosis not present

## 2021-07-10 DIAGNOSIS — L905 Scar conditions and fibrosis of skin: Secondary | ICD-10-CM | POA: Diagnosis not present

## 2021-07-10 DIAGNOSIS — L570 Actinic keratosis: Secondary | ICD-10-CM | POA: Diagnosis not present

## 2021-07-10 DIAGNOSIS — L814 Other melanin hyperpigmentation: Secondary | ICD-10-CM | POA: Diagnosis not present

## 2021-07-10 DIAGNOSIS — L249 Irritant contact dermatitis, unspecified cause: Secondary | ICD-10-CM | POA: Diagnosis not present

## 2021-07-10 DIAGNOSIS — L821 Other seborrheic keratosis: Secondary | ICD-10-CM | POA: Diagnosis not present

## 2021-07-10 DIAGNOSIS — D225 Melanocytic nevi of trunk: Secondary | ICD-10-CM | POA: Diagnosis not present

## 2021-09-13 DIAGNOSIS — H35711 Central serous chorioretinopathy, right eye: Secondary | ICD-10-CM | POA: Diagnosis not present

## 2021-09-13 DIAGNOSIS — H35033 Hypertensive retinopathy, bilateral: Secondary | ICD-10-CM | POA: Diagnosis not present

## 2021-09-13 DIAGNOSIS — H34832 Tributary (branch) retinal vein occlusion, left eye, with macular edema: Secondary | ICD-10-CM | POA: Diagnosis not present

## 2021-09-13 DIAGNOSIS — H43813 Vitreous degeneration, bilateral: Secondary | ICD-10-CM | POA: Diagnosis not present

## 2021-11-12 DIAGNOSIS — L570 Actinic keratosis: Secondary | ICD-10-CM | POA: Diagnosis not present

## 2021-11-12 DIAGNOSIS — D2239 Melanocytic nevi of other parts of face: Secondary | ICD-10-CM | POA: Diagnosis not present

## 2021-11-14 ENCOUNTER — Emergency Department (HOSPITAL_BASED_OUTPATIENT_CLINIC_OR_DEPARTMENT_OTHER): Payer: PPO

## 2021-11-14 ENCOUNTER — Other Ambulatory Visit: Payer: Self-pay

## 2021-11-14 ENCOUNTER — Observation Stay (HOSPITAL_BASED_OUTPATIENT_CLINIC_OR_DEPARTMENT_OTHER)
Admission: EM | Admit: 2021-11-14 | Discharge: 2021-11-16 | Disposition: A | Discharge status: Home / Self Care | Payer: PPO | Attending: Internal Medicine | Admitting: Internal Medicine

## 2021-11-14 ENCOUNTER — Encounter (HOSPITAL_BASED_OUTPATIENT_CLINIC_OR_DEPARTMENT_OTHER): Payer: Self-pay

## 2021-11-14 DIAGNOSIS — Z20822 Contact with and (suspected) exposure to covid-19: Secondary | ICD-10-CM | POA: Diagnosis not present

## 2021-11-14 DIAGNOSIS — R0602 Shortness of breath: Secondary | ICD-10-CM

## 2021-11-14 DIAGNOSIS — K8020 Calculus of gallbladder without cholecystitis without obstruction: Secondary | ICD-10-CM | POA: Diagnosis not present

## 2021-11-14 DIAGNOSIS — Z7982 Long term (current) use of aspirin: Secondary | ICD-10-CM | POA: Diagnosis not present

## 2021-11-14 DIAGNOSIS — I160 Hypertensive urgency: Secondary | ICD-10-CM | POA: Diagnosis not present

## 2021-11-14 DIAGNOSIS — I10 Essential (primary) hypertension: Secondary | ICD-10-CM | POA: Insufficient documentation

## 2021-11-14 DIAGNOSIS — R1013 Epigastric pain: Secondary | ICD-10-CM

## 2021-11-14 DIAGNOSIS — N400 Enlarged prostate without lower urinary tract symptoms: Secondary | ICD-10-CM | POA: Insufficient documentation

## 2021-11-14 DIAGNOSIS — I77819 Aortic ectasia, unspecified site: Secondary | ICD-10-CM | POA: Diagnosis not present

## 2021-11-14 DIAGNOSIS — Z79899 Other long term (current) drug therapy: Secondary | ICD-10-CM | POA: Insufficient documentation

## 2021-11-14 DIAGNOSIS — K219 Gastro-esophageal reflux disease without esophagitis: Secondary | ICD-10-CM | POA: Diagnosis not present

## 2021-11-14 DIAGNOSIS — N401 Enlarged prostate with lower urinary tract symptoms: Secondary | ICD-10-CM | POA: Diagnosis present

## 2021-11-14 LAB — CBC
HCT: 50 % (ref 39.0–52.0)
Hemoglobin: 16.8 g/dL (ref 13.0–17.0)
MCH: 29 pg (ref 26.0–34.0)
MCHC: 33.6 g/dL (ref 30.0–36.0)
MCV: 86.4 fL (ref 80.0–100.0)
Platelets: 199 10*3/uL (ref 150–400)
RBC: 5.79 MIL/uL (ref 4.22–5.81)
RDW: 12.8 % (ref 11.5–15.5)
WBC: 10.4 10*3/uL (ref 4.0–10.5)
nRBC: 0 % (ref 0.0–0.2)

## 2021-11-14 LAB — TROPONIN I (HIGH SENSITIVITY): Troponin I (High Sensitivity): 5 ng/L (ref <18)

## 2021-11-14 MED ORDER — HYDROMORPHONE HCL 1 MG/ML IJ SOLN
1.0000 mg | Freq: Once | INTRAMUSCULAR | Status: AC
  Administered 2021-11-14: 1 mg via INTRAVENOUS
  Filled 2021-11-14: qty 1

## 2021-11-14 NOTE — ED Triage Notes (Signed)
Intermittent abdominal pain for one week. Patient states it usually starts after he eats. Denies diarrhea or vomiting, but has had some nausea.  ?

## 2021-11-15 ENCOUNTER — Encounter (HOSPITAL_BASED_OUTPATIENT_CLINIC_OR_DEPARTMENT_OTHER): Payer: Self-pay | Admitting: Radiology

## 2021-11-15 ENCOUNTER — Emergency Department (HOSPITAL_BASED_OUTPATIENT_CLINIC_OR_DEPARTMENT_OTHER): Payer: PPO

## 2021-11-15 ENCOUNTER — Observation Stay (HOSPITAL_COMMUNITY): Payer: PPO

## 2021-11-15 DIAGNOSIS — I7409 Other arterial embolism and thrombosis of abdominal aorta: Secondary | ICD-10-CM | POA: Diagnosis not present

## 2021-11-15 DIAGNOSIS — K219 Gastro-esophageal reflux disease without esophagitis: Secondary | ICD-10-CM | POA: Diagnosis not present

## 2021-11-15 DIAGNOSIS — R1013 Epigastric pain: Secondary | ICD-10-CM | POA: Diagnosis not present

## 2021-11-15 DIAGNOSIS — Z20822 Contact with and (suspected) exposure to covid-19: Secondary | ICD-10-CM | POA: Diagnosis not present

## 2021-11-15 DIAGNOSIS — Z79899 Other long term (current) drug therapy: Secondary | ICD-10-CM | POA: Diagnosis not present

## 2021-11-15 DIAGNOSIS — K8020 Calculus of gallbladder without cholecystitis without obstruction: Secondary | ICD-10-CM

## 2021-11-15 DIAGNOSIS — I77819 Aortic ectasia, unspecified site: Secondary | ICD-10-CM | POA: Diagnosis not present

## 2021-11-15 DIAGNOSIS — R0602 Shortness of breath: Secondary | ICD-10-CM | POA: Diagnosis not present

## 2021-11-15 DIAGNOSIS — N281 Cyst of kidney, acquired: Secondary | ICD-10-CM | POA: Diagnosis not present

## 2021-11-15 DIAGNOSIS — I160 Hypertensive urgency: Secondary | ICD-10-CM

## 2021-11-15 DIAGNOSIS — K760 Fatty (change of) liver, not elsewhere classified: Secondary | ICD-10-CM | POA: Diagnosis not present

## 2021-11-15 DIAGNOSIS — I10 Essential (primary) hypertension: Secondary | ICD-10-CM | POA: Diagnosis not present

## 2021-11-15 DIAGNOSIS — N400 Enlarged prostate without lower urinary tract symptoms: Secondary | ICD-10-CM | POA: Diagnosis not present

## 2021-11-15 DIAGNOSIS — Z7982 Long term (current) use of aspirin: Secondary | ICD-10-CM | POA: Diagnosis not present

## 2021-11-15 LAB — COMPREHENSIVE METABOLIC PANEL
ALT: 32 U/L (ref 0–44)
ALT: 28 U/L (ref 0–44)
AST: 47 U/L — ABNORMAL HIGH (ref 15–41)
AST: 30 U/L (ref 15–41)
Albumin: 4.8 g/dL (ref 3.5–5.0)
Albumin: 4.1 g/dL (ref 3.5–5.0)
Alkaline Phosphatase: 57 U/L (ref 38–126)
Alkaline Phosphatase: 56 U/L (ref 38–126)
Anion gap: 11 (ref 5–15)
Anion gap: 12 (ref 5–15)
BUN: 22 mg/dL (ref 8–23)
BUN: 22 mg/dL (ref 8–23)
CO2: 26 mmol/L (ref 22–32)
CO2: 23 mmol/L (ref 22–32)
Calcium: 9.5 mg/dL (ref 8.9–10.3)
Calcium: 9.1 mg/dL (ref 8.9–10.3)
Chloride: 104 mmol/L (ref 98–111)
Chloride: 104 mmol/L (ref 98–111)
Creatinine, Ser: 1.14 mg/dL (ref 0.61–1.24)
Creatinine, Ser: 1.16 mg/dL (ref 0.61–1.24)
GFR, Estimated: >60 mL/min (ref >60)
GFR, Estimated: >60 mL/min (ref >60)
Glucose, Bld: 139 mg/dL — ABNORMAL HIGH (ref 70–99)
Glucose, Bld: 142 mg/dL — ABNORMAL HIGH (ref 70–99)
Potassium: 4.6 mmol/L (ref 3.5–5.1)
Potassium: 3.3 mmol/L — ABNORMAL LOW (ref 3.5–5.1)
Sodium: 141 mmol/L (ref 135–145)
Sodium: 139 mmol/L (ref 135–145)
Total Bilirubin: 0.8 mg/dL (ref 0.3–1.2)
Total Bilirubin: 0.8 mg/dL (ref 0.3–1.2)
Total Protein: 8 g/dL (ref 6.5–8.1)
Total Protein: 7.3 g/dL (ref 6.5–8.1)

## 2021-11-15 LAB — MAGNESIUM: Magnesium: 2 mg/dL (ref 1.7–2.4)

## 2021-11-15 LAB — TSH: TSH: 0.742 u[IU]/mL (ref 0.350–4.500)

## 2021-11-15 LAB — URINALYSIS, ROUTINE W REFLEX MICROSCOPIC
Bilirubin Urine: NEGATIVE
Glucose, UA: NEGATIVE mg/dL
Hgb urine dipstick: NEGATIVE
Ketones, ur: NEGATIVE mg/dL
Leukocytes,Ua: NEGATIVE
Nitrite: NEGATIVE
Specific Gravity, Urine: 1.017 (ref 1.005–1.030)
pH: 7.5 (ref 5.0–8.0)

## 2021-11-15 LAB — RESP PANEL BY RT-PCR (FLU A&B, COVID) ARPGX2
Influenza A by PCR: NEGATIVE
Influenza B by PCR: NEGATIVE
SARS Coronavirus 2 by RT PCR: NEGATIVE

## 2021-11-15 LAB — BRAIN NATRIURETIC PEPTIDE: B Natriuretic Peptide: 72.7 pg/mL (ref 0.0–100.0)

## 2021-11-15 LAB — HIV ANTIBODY (ROUTINE TESTING W REFLEX): HIV Screen 4th Generation wRfx: NONREACTIVE

## 2021-11-15 LAB — LIPASE, BLOOD: Lipase: 22 U/L (ref 11–51)

## 2021-11-15 LAB — TROPONIN I (HIGH SENSITIVITY): Troponin I (High Sensitivity): 6 ng/L (ref <18)

## 2021-11-15 MED ORDER — NITROGLYCERIN IN D5W 200-5 MCG/ML-% IV SOLN
0.0000 ug/min | INTRAVENOUS | Status: DC
  Administered 2021-11-15: 5 ug/min via INTRAVENOUS
  Filled 2021-11-15: qty 250

## 2021-11-15 MED ORDER — HYDRALAZINE HCL 20 MG/ML IJ SOLN
10.0000 mg | Freq: Three times a day (TID) | INTRAMUSCULAR | Status: DC | PRN
  Administered 2021-11-15: 10 mg via INTRAVENOUS
  Filled 2021-11-15: qty 1

## 2021-11-15 MED ORDER — FINASTERIDE 5 MG PO TABS
5.0000 mg | ORAL_TABLET | Freq: Every day | ORAL | Status: DC
  Administered 2021-11-15 – 2021-11-16 (×2): 5 mg via ORAL
  Filled 2021-11-15 (×2): qty 1

## 2021-11-15 MED ORDER — ALUM & MAG HYDROXIDE-SIMETH 200-200-20 MG/5ML PO SUSP
30.0000 mL | Freq: Once | ORAL | Status: AC
  Administered 2021-11-15: 30 mL via ORAL

## 2021-11-15 MED ORDER — LIDOCAINE VISCOUS HCL 2 % MT SOLN
15.0000 mL | Freq: Once | OROMUCOSAL | Status: AC
Start: 2021-11-15 — End: 2021-11-15
  Administered 2021-11-15: 15 mL via ORAL

## 2021-11-15 MED ORDER — HYDROCODONE-ACETAMINOPHEN 5-325 MG PO TABS
1.0000 | ORAL_TABLET | ORAL | Status: DC | PRN
  Administered 2021-11-15: 1 via ORAL
  Filled 2021-11-15: qty 1

## 2021-11-15 MED ORDER — LISINOPRIL 20 MG PO TABS
40.0000 mg | ORAL_TABLET | Freq: Every day | ORAL | Status: DC
  Administered 2021-11-15 – 2021-11-16 (×2): 40 mg via ORAL
  Filled 2021-11-15 (×2): qty 2

## 2021-11-15 MED ORDER — ACETAMINOPHEN 325 MG PO TABS: 650.0000 mg | ORAL_TABLET | Freq: Four times a day (QID) | ORAL | Status: DC | PRN

## 2021-11-15 MED ORDER — SODIUM CHLORIDE 0.9 % IV SOLN: 250.0000 mL | INTRAVENOUS | Status: DC | PRN

## 2021-11-15 MED ORDER — FENOFIBRATE 160 MG PO TABS
160.0000 mg | ORAL_TABLET | Freq: Every day | ORAL | Status: DC
  Administered 2021-11-15 – 2021-11-16 (×2): 160 mg via ORAL
  Filled 2021-11-15 (×2): qty 1

## 2021-11-15 MED ORDER — ASPIRIN EC 81 MG PO TBEC
81.0000 mg | DELAYED_RELEASE_TABLET | Freq: Every day | ORAL | Status: DC
  Administered 2021-11-15 – 2021-11-16 (×2): 81 mg via ORAL
  Filled 2021-11-15 (×2): qty 1

## 2021-11-15 MED ORDER — SODIUM CHLORIDE 0.9% FLUSH: 3.0000 mL | INTRAVENOUS | Status: DC | PRN

## 2021-11-15 MED ORDER — ATENOLOL 25 MG PO TABS
50.0000 mg | ORAL_TABLET | Freq: Every day | ORAL | Status: DC
  Administered 2021-11-15 – 2021-11-16 (×2): 50 mg via ORAL
  Filled 2021-11-15 (×2): qty 2

## 2021-11-15 MED ORDER — SODIUM CHLORIDE 0.9% FLUSH
3.0000 mL | Freq: Two times a day (BID) | INTRAVENOUS | Status: DC
  Administered 2021-11-15 – 2021-11-16 (×2): 3 mL via INTRAVENOUS

## 2021-11-15 MED ORDER — IOHEXOL 350 MG/ML SOLN
100.0000 mL | Freq: Once | INTRAVENOUS | Status: AC | PRN
  Administered 2021-11-15: 100 mL via INTRAVENOUS

## 2021-11-15 MED ORDER — HYDROMORPHONE HCL 1 MG/ML IJ SOLN
1.0000 mg | Freq: Once | INTRAMUSCULAR | Status: AC
  Administered 2021-11-15: 1 mg via INTRAVENOUS
  Filled 2021-11-15: qty 1

## 2021-11-15 MED ORDER — PANTOPRAZOLE SODIUM 40 MG IV SOLR
40.0000 mg | INTRAVENOUS | Status: DC
  Administered 2021-11-15: 40 mg via INTRAVENOUS
  Filled 2021-11-15: qty 10

## 2021-11-15 MED ORDER — ACETAMINOPHEN 650 MG RE SUPP: 650.0000 mg | Freq: Four times a day (QID) | RECTAL | Status: DC | PRN

## 2021-11-15 MED ORDER — PANTOPRAZOLE SODIUM 40 MG IV SOLR
40.0000 mg | Freq: Once | INTRAVENOUS | Status: AC
  Administered 2021-11-15: 40 mg via INTRAVENOUS
  Filled 2021-11-15: qty 10

## 2021-11-15 NOTE — Assessment & Plan Note (Addendum)
No evidence of infection/obstruction or need for urgent intervention. Pain not in RUQ with negative murphy sign, but pain associated with fatty/greasy foods.  ?-? If more from possible PUD ?-discussed AST only 47 with no other evidence of obstruction ?-would f/u outpatient for this and work on dietary changes, may need further imaging  ?-follow LFTs while inpatient  ?

## 2021-11-15 NOTE — Assessment & Plan Note (Signed)
Continue proscar  ?

## 2021-11-15 NOTE — Assessment & Plan Note (Addendum)
71 year old male presenting with epigastric pain and hypertensive urgency with systolic blood pressure over 200.  ?-place in obs on telemetry ?-imaging negative for dissection  ?-troponin wnl and ekg with no significant finding ?-no neurological deficits, headaches/vision changes ?-placed on nitro drip and bp now 150/90. Will titrate off and start back his home medication and see how he does. May need bp adjustment. Hydralazine prn.  ?-per EDP had sinus arrhythmia on monitor and cardiology has been consulted. May benefit from zio monitor: check TSH/magnesium ?-has had some dyspnea on exertion, no other signs of CHF, will check BNP/CXR  ?-pain control/PPI for epigastric pain-currently pain free  ?

## 2021-11-15 NOTE — Assessment & Plan Note (Signed)
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 ?bp control, f/u on card rec ?

## 2021-11-15 NOTE — H&P (Addendum)
History and Physical    Patient: Christopher Castillo AJO:[id number] DOB: 1951/02/24 DOA: 11/14/2021 DOS: the patient was seen and examined on 11/15/2021 PCP: Tisovec, Fransico Him, MD  Patient coming from:  [facility]  - lives with his wife    Chief Complaint: epigastric pain   HPI: Christopher Castillo is a 71 y.o. male with medical history significant of HTN, BPH, GERD, dyslipidemia, who presented to Ed with epigastric pain. He states he has had intermittent pain for about one week or longer. Symptoms became more frequent. If he ate anything fried or greasy it seemed to trigger the pain. Pain rated as a 10/10 last night and described as achy in nature. No radiation. Pain typically lasts for several hours. Nothing seems to make it better. + nausea at times. No associated vomiting or diarrhea. He has had greasy food this week and last night. Last night pain was so bad he went to Ed around 11pm.   He does not take NSAIDs on a daily basis, no international travel, no well water, no hx of h.pylori. has been extremely stressed. Limited caffeine, no alcohol.   He denies any fever/chills, no headaches/dizziness, no chest pain or palpitations, no shortness of breath or cough, no dysuria or leg swelling. He does state he has had some dyspnea on exertion. No weight gain. ? Orthopnea.   Does not smoke or drink.   ER Course:  vitals: afebrile, bp: 218/131, HR; 66, RR: 14, oxygen: 98%RA Pertinent labs: none RUQ [place]: cholelithiasis without sonographic evidence of acute cholecystitis. Fatty liver Ct angio chest/abdo/pelvis: no dissection. 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 Cardiology consulted, patient given protonix and started on nitro gtt for bp.    Review of Systems: As mentioned in the history of present illness. All other systems reviewed and are negative. Past Medical History:  Diagnosis Date   GERD (gastroesophageal reflux disease)    History of  kidney stones    History of urinary tract infection    Hypertension    Past Surgical History:  Procedure Laterality Date   colonscopy     EXTRACORPOREAL SHOCK WAVE LITHOTRIPSY Right 12/12/2016   Procedure: RIGHT EXTRACORPOREAL SHOCK WAVE LITHOTRIPSY (ESWL);  Surgeon: Cleon Gustin, MD;  Location: [facility];  Service: Urology;  Laterality: Right;   Social History:  reports that he has never smoked. He has never used smokeless tobacco. He reports that he does not drink alcohol and does not use drugs.  No Known Allergies  No family history on file.  Prior to Admission medications   Medication Sig Start Date End Date Taking? Authorizing Provider  aspirin EC 81 MG tablet Take 81 mg by mouth daily.   Yes [provider]  atenolol (TENORMIN) 50 MG tablet Take 50 mg by mouth daily.   Yes [provider]  fenofibrate (TRICOR) 145 MG tablet Take 145 mg by mouth daily.   Yes [provider]  finasteride (PROSCAR) 5 MG tablet TAKE 1 TABLET BY MOUTH EVERY DAY 03/30/20  Yes McKenzie, Candee Furbish, MD  lisinopril (PRINIVIL,ZESTRIL) 40 MG tablet Take 40 mg by mouth daily.   Yes [provider]  Multiple Vitamin (MULTIVITAMIN) tablet Take 1 tablet by mouth daily.   Yes [provider]  omeprazole (PRILOSEC) 20 MG capsule Take 20 mg by mouth daily.   Yes [provider]    Physical Exam: Vitals:   11/15/21 1757 11/15/21 1801 11/15/21 1836 11/15/21 1909  BP: Marland Kitchen)  145/87 140/87 (!) 136/97 (!) 175/92  Pulse: 71 69 83 80  Resp:      Temp:      TempSrc:      SpO2:      Weight:      Height:       General:  Appears calm and comfortable and is in NAD Eyes:  PERRL, EOMI, normal lids, iris ENT:  grossly normal hearing, lips & tongue, mmm; appropriate dentition Neck:  no LAD, masses or thyromegaly; no carotid bruits Cardiovascular:  tachycardic, no m/r/g. No LE edema.  Respiratory:   CTA bilaterally with no wheezes/rales/rhonchi.  Normal respiratory  effort. Abdomen:  soft, NT, ND, NABS. Negative murphy sign.  Back:   normal alignment, no CVAT Skin:  no rash or induration seen on limited exam Musculoskeletal:  grossly normal tone BUE/BLE, good ROM, no bony abnormality Lower extremity:  No LE edema.  Limited foot exam with no ulcerations.  2+ distal pulses. Psychiatric:  grossly normal mood and affect, speech fluent and appropriate, AOx3 Neurologic:  CN 2-12 grossly intact, moves all extremities in coordinated fashion, sensation intact   Radiological Exams on Admission: Independently reviewed - see discussion in A/P where applicable  DG CHEST PORT 1 VIEW  Result Date: 11/15/2021 CLINICAL DATA:  Shortness of breath. EXAM: PORTABLE CHEST 1 VIEW COMPARISON:  CTA chest from same day. FINDINGS: The heart size and mediastinal contours are within normal limits. Both lungs are clear. The visualized skeletal structures are unremarkable. IMPRESSION: No active disease. Electronically Signed   By: Titus Dubin M.D.   On: 11/15/2021 16:35   CT Angio Chest/Abd/Pel for Dissection W and/or Wo Contrast  Result Date: 11/15/2021 CLINICAL DATA:  Epigastric abdominal pain. Assess for dissection, AAA EXAM: CT ANGIOGRAPHY CHEST, ABDOMEN AND PELVIS TECHNIQUE: Non-contrast CT of the chest was initially obtained. Multidetector CT imaging through the chest, abdomen and pelvis was performed using the standard protocol during bolus administration of intravenous contrast. Multiplanar reconstructed images and MIPs were obtained and reviewed to evaluate the vascular anatomy. RADIATION DOSE REDUCTION: This exam was performed according to the departmental dose-optimization program which includes automated exposure control, adjustment of the mA and/or kV according to patient size and/or use of iterative reconstruction technique. CONTRAST:  122mL OMNIPAQUE IOHEXOL 350 MG/ML SOLN COMPARISON:  11/12/2016 FINDINGS: CTA CHEST FINDINGS Cardiovascular: Heart is normal size. Scattered  coronary artery calcifications. No aneurysm or dissection. No filling defects in the pulmonary arteries to suggest pulmonary emboli. Mediastinum/Nodes: No mediastinal, hilar, or axillary adenopathy. Trachea and esophagus are unremarkable. Thyroid unremarkable. Small hiatal hernia. Lungs/Pleura: Lungs are clear. No focal airspace opacities or suspicious nodules. No effusions. Musculoskeletal: Chest wall soft tissues are unremarkable. No acute bony abnormality. Review of the MIP images confirms the above findings. CTA ABDOMEN AND PELVIS FINDINGS VASCULAR Aorta: Scattered aortic calcifications. 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 No dissection. Celiac: Patent without evidence of aneurysm, dissection, vasculitis or significant stenosis. SMA: Patent without evidence of aneurysm, dissection, vasculitis or significant stenosis. Renals: Both renal arteries are patent without evidence of aneurysm, dissection, vasculitis, fibromuscular dysplasia or significant stenosis. IMA: Patent without evidence of aneurysm, dissection, vasculitis or significant stenosis. Inflow: Patent without evidence of aneurysm, dissection, vasculitis or significant stenosis. Veins: No obvious venous abnormality within the limitations of this arterial phase study. Review of the MIP images confirms the above findings. NON-VASCULAR Hepatobiliary: Diffuse low-density throughout the liver compatible with fatty infiltration. No focal abnormality. Small gallstones within the gallbladder. Pancreas:  No focal abnormality or ductal dilatation. Spleen: No focal abnormality.  Normal size. Adrenals/Urinary Tract: 3.4 cm exophytic cyst off the upper pole of the right kidney. Other smaller scattered right renal cysts. No hydronephrosis. Adrenal glands and urinary bladder unremarkable. Stomach/Bowel: Normal appendix. Stomach, large and small bowel grossly unremarkable. Lymphatic: No adenopathy Reproductive: Prostate enlargement  Other: No free fluid or free air. Musculoskeletal: No acute bony abnormality. Review of the MIP images confirms the above findings. IMPRESSION: No evidence of aortic dissection. 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 Small hiatal hernia. Scattered coronary artery and aortic calcifications. Hepatic steatosis. Cholelithiasis. Prostate enlargement. Electronically Signed   By: Rolm Baptise M.D.   On: 11/15/2021 01:06   US Abdomen Limited RUQ (LIVER/GB)  Result Date: 11/15/2021 CLINICAL DATA:  Epigastric pain. EXAM: ULTRASOUND ABDOMEN LIMITED RIGHT UPPER QUADRANT COMPARISON:  None. FINDINGS: Gallbladder: There is gallstone. No gallbladder wall thickening or pericholecystic fluid. Negative sonographic Murphy's sign. Common bile duct: Diameter: 5 mm Liver: There is diffuse increased liver echogenicity most commonly seen in the setting of fatty infiltration. Superimposed inflammation or fibrosis is not excluded. Clinical correlation is recommended. Portal vein is patent on color Doppler imaging with normal direction of blood flow towards the liver. Other: None. IMPRESSION: 1. Cholelithiasis without sonographic evidence of acute cholecystitis. 2. Fatty liver. Electronically Signed   By: Anner Crete M.D.   On: 11/15/2021 00:19    EKG: Independently reviewed.  NSR with rate 70 with PVC; nonspecific ST changes with no evidence of acute ischemia   Labs on Admission: I have personally reviewed the available labs and imaging studies at the time of the admission.  Pertinent labs:   AST: 47   Assessment and Plan: * Hypertensive urgency 71 year old male presenting with epigastric pain and hypertensive urgency with systolic blood pressure over 200.  -place in obs on telemetry -imaging negative for dissection  -troponin wnl and ekg with no significant finding -no neurological deficits, headaches/vision changes -placed on nitro drip and bp now 150/90.  Will titrate off and start back his home medication and see how he does. May need bp adjustment. Hydralazine prn.  -per EDP had sinus arrhythmia on monitor and cardiology has been consulted. May benefit from zio monitor: check TSH/magnesium -has had some dyspnea on exertion, no other signs of CHF, will check BNP/CXR  -pain control/PPI for epigastric pain-currently pain free   Epigastric pain Concern for PUD.  -check h.pylori stool antigen. Has been on omeprazole 20mg , so can not do breath test -start protonix 40mg  IV -not on NSAIDs, limited caffeine. Has been under an immense amount of stress due to family related issues   Cholelithiasis No evidence of infection/obstruction or need for urgent intervention. Pain not in RUQ with negative murphy sign, but pain associated with fatty/greasy foods.  -? 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 bp control, f/u on card rec    Advance Care Planning:   Code Status: Full Code   Consults: cardiology by edp   DVT Prophylaxis: early ambulation  Family Communication: none   Severity of Illness: The appropriate patient status for this patient is OBSERVATION. Observation status is judged to be reasonable and necessary in order to provide the required intensity of service to ensure the patient's safety. The patient's presenting symptoms, physical exam findings, and initial radiographic and laboratory data in the context of their medical condition is felt to place them at decreased risk for further clinical  deterioration. Furthermore, it is anticipated that the patient will be medically stable for discharge from the hospital within 2 midnights of admission.   Author: Orma Flaming, MD 11/15/2021 7:13 PM  For on call review [email or web address].

## 2021-11-15 NOTE — Progress Notes (Signed)
  [facility] will assume care on arrival to accepting facility. Until arrival, care as per EDP. However, [facility] available 24/7 for questions and assistance.   Nursing staff please page [facility] Admits and Consults ([phone]) as soon as the patient arrives to the hospital.  Ghadeer Kastelic, DO [facility]  

## 2021-11-15 NOTE — Assessment & Plan Note (Signed)
Continue PPI>IV ?

## 2021-11-15 NOTE — ED Notes (Signed)
Pt denis pain 

## 2021-11-15 NOTE — Consult Note (Addendum)
Cardiology Consultation:   Patient ID: Christopher Castillo MRN: [id number]; DOB: [birth year]  Admit date: 11/14/2021 Date of Consult: 11/15/2021  PCP:  Gaspar Garbe, MD   [facility] Providers Cardiologist:  Donato Schultz, MD new:1}     Patient Profile:   Christopher Castillo is a 71 y.o. male with a hx of HTN, HLD, and GERD who is being seen 11/15/2021 for the evaluation of PACs and tachycardia at the request of Dr. Artis Flock.  History of Present Illness:   [name] does not have prior cardiac history. HTN managed with 40 mg lisinopril and 50 mg atenolol. HLD managed with fenofibrate. He presented to [facility] with epigastric pain after eating fried/greasy food. Imaging thusfar indicate cholelithiasis without evidence of cholecystitis. 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   Baseline heart rates in the 60s documented in 2022 and 70s in 2020.   Cardiology was consulted for episodes of bradycardia and tachycardia with arrhythmia noted on telemetry in the ER.   EKG shows sinus rhythm with episodes of PACs. Pt is now in what appears to be atrial tachycardia with episodes of PACs and atrial bigeminy, in the 100-110s. During my interview, he recounts epigastric pain, but denies chest pain and dyspnea. He denies heart disease, but does have a family history of premature heart disease (father in his 51s, CABG x 5). He reports mild dyspnea on exertion. He is active and can complete more than 4.0 METS without angina.    Past Medical History:  Diagnosis Date   GERD (gastroesophageal reflux disease)    History of kidney stones    History of urinary tract infection    Hypertension     Past Surgical History:  Procedure Laterality Date   colonscopy     EXTRACORPOREAL SHOCK WAVE LITHOTRIPSY Right 12/12/2016   Procedure: RIGHT EXTRACORPOREAL SHOCK WAVE LITHOTRIPSY (ESWL);  Surgeon: Malen Gauze, MD;   Location: [facility];  Service: Urology;  Laterality: Right;     Home Medications:  Prior to Admission medications   Medication Sig Start Date End Date Taking? Authorizing Provider  aspirin EC 81 MG tablet Take 81 mg by mouth daily.   Yes [provider]  atenolol (TENORMIN) 50 MG tablet Take 50 mg by mouth daily.   Yes [provider]  fenofibrate (TRICOR) 145 MG tablet Take 145 mg by mouth daily.   Yes [provider]  finasteride (PROSCAR) 5 MG tablet TAKE 1 TABLET BY MOUTH EVERY DAY 03/30/20  Yes McKenzie, Mardene Celeste, MD  lisinopril (PRINIVIL,ZESTRIL) 40 MG tablet Take 40 mg by mouth daily.   Yes [provider]  Multiple Vitamin (MULTIVITAMIN) tablet Take 1 tablet by mouth daily.   Yes [provider]  omeprazole (PRILOSEC) 20 MG capsule Take 20 mg by mouth daily.   Yes [provider]    Inpatient Medications: Scheduled Meds:  aspirin EC  81 mg Oral Daily   atenolol  50 mg Oral Daily   fenofibrate  160 mg Oral Daily   finasteride  5 mg Oral Daily   lisinopril  40 mg Oral Daily   pantoprazole (PROTONIX) IV  40 mg Intravenous Q24H   sodium chloride flush  3 mL Intravenous Q12H   Continuous Infusions:  sodium chloride     PRN Meds:   Allergies:   No Known Allergies  Social History:   Social History   Socioeconomic History   Marital status: Married  Spouse name: Not on file   Number of children: Not on file   Years of education: Not on file   Highest education level: Not on file  Occupational History   Not on file  Tobacco Use   Smoking status: Never   Smokeless tobacco: Never  Substance and Sexual Activity   Alcohol use: No   Drug use: No   Sexual activity: Not on file  Other Topics Concern   Not on file  Social History Narrative   Not on file   Social Determinants of Health   Financial Resource Strain: Not on file  Food Insecurity: Not on file  Transportation Needs: Not on file  Physical Activity: Not  on file  Stress: Not on file  Social Connections: Not on file  Intimate Partner Violence: Not on file    Family History:   No family history on file.   ROS:  Please see the history of present illness.   All other ROS reviewed and negative.     Physical Exam/Data:   Vitals:   11/15/21 1431 11/15/21 1435 11/15/21 1451 11/15/21 1452  BP: (!) 152/99 (!) 153/91    Pulse: (!) 110 (!) 111  (!) 102  Resp: (!) 21   20  Temp: 98.6 F (37 C)     TempSrc: Oral     SpO2: 97% 95%    Weight:  74.3 kg 73.2 kg   Height:  5\' 6"  (1.676 m) 5\' 6"  (1.676 m)     Intake/Output Summary (Last 24 hours) at 11/15/2021 1612 Last data filed at 11/15/2021 1100 Gross per 24 hour  Intake --  Output 850 ml  Net -850 ml   Last 3 Weights 11/15/2021 11/15/2021 11/14/2021  Weight (lbs) 161 lb 6 oz 163 lb 12.8 oz 164 lb  Weight (kg) 73.2 kg 74.3 kg 74.39 kg     Body mass index is 26.05 kg/m.  General:  Well nourished, well developed, in no acute distress HEENT: normal Neck: no JVD Vascular: No carotid bruits; Distal pulses 2+ bilaterally Cardiac:  normal S1, S2; RRR; no murmur  Lungs:  clear to auscultation bilaterally, no wheezing, rhonchi or rales  Abd: soft, nontender, no hepatomegaly  Ext: no edema Musculoskeletal:  No deformities, BUE and BLE strength normal and equal Skin: warm and dry  Neuro:  CNs 2-12 intact, no focal abnormalities noted Psych:  Normal affect   EKG:  The EKG was personally reviewed and demonstrates:  sinus rhythm with PACs, HR 77, poor R wave progression Telemetry:  Telemetry was personally reviewed and demonstrates:  sinus rhythm with PACs, atrial bigeminy, and atrial tachycardia, 60-110s  Relevant CV Studies:  none  Laboratory Data:  High Sensitivity Troponin:   Recent Labs  Lab 11/14/21 2323 11/15/21 0120  TROPONINIHS 5 6     Chemistry Recent Labs  Lab 11/14/21 2323  NA 141  K 4.6  CL 104  CO2 26  GLUCOSE 139*  BUN 22  CREATININE 1.14  CALCIUM 9.5   GFRNONAA >60  ANIONGAP 11    Recent Labs  Lab 11/14/21 2323  PROT 8.0  ALBUMIN 4.8  AST 47*  ALT 32  ALKPHOS 57  BILITOT 0.8   Lipids No results for input(s): CHOL, TRIG, HDL, LABVLDL, LDLCALC, CHOLHDL in the last 168 hours.  Hematology Recent Labs  Lab 11/14/21 2323  WBC 10.4  RBC 5.79  HGB 16.8  HCT 50.0  MCV 86.4  MCH 29.0  MCHC 33.6  RDW 12.8  PLT  199   Thyroid No results for input(s): TSH, FREET4 in the last 168 hours.  BNPNo results for input(s): BNP, PROBNP in the last 168 hours.  DDimer No results for input(s): DDIMER in the last 168 hours.   Radiology/Studies:  CT Angio Chest/Abd/Pel for Dissection W and/or Wo Contrast  Result Date: 11/15/2021 CLINICAL DATA:  Epigastric abdominal pain. Assess for dissection, AAA EXAM: CT ANGIOGRAPHY CHEST, ABDOMEN AND PELVIS TECHNIQUE: Non-contrast CT of the chest was initially obtained. Multidetector CT imaging through the chest, abdomen and pelvis was performed using the standard protocol during bolus administration of intravenous contrast. Multiplanar reconstructed images and MIPs were obtained and reviewed to evaluate the vascular anatomy. RADIATION DOSE REDUCTION: This exam was performed according to the departmental dose-optimization program which includes automated exposure control, adjustment of the mA and/or kV according to patient size and/or use of iterative reconstruction technique. CONTRAST:  OMNIPAQUE IOHEXOL 350 MG/ML SOLN COMPARISON:  11/12/2016 FINDINGS: CTA CHEST FINDINGS Cardiovascular: Heart is normal size. Scattered coronary artery calcifications. No aneurysm or dissection. No filling defects in the pulmonary arteries to suggest pulmonary emboli. Mediastinum/Nodes: No mediastinal, hilar, or axillary adenopathy. Trachea and esophagus are unremarkable. Thyroid unremarkable. Small hiatal hernia. Lungs/Pleura: Lungs are clear. No focal airspace opacities or suspicious nodules. No effusions. Musculoskeletal: Chest  wall soft tissues are unremarkable. No acute bony abnormality. Review of the MIP images confirms the above findings. CTA ABDOMEN AND PELVIS FINDINGS VASCULAR Aorta: Scattered aortic calcifications. 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 No dissection. Celiac: Patent without evidence of aneurysm, dissection, vasculitis or significant stenosis. SMA: Patent without evidence of aneurysm, dissection, vasculitis or significant stenosis. Renals: Both renal arteries are patent without evidence of aneurysm, dissection, vasculitis, fibromuscular dysplasia or significant stenosis. IMA: Patent without evidence of aneurysm, dissection, vasculitis or significant stenosis. Inflow: Patent without evidence of aneurysm, dissection, vasculitis or significant stenosis. Veins: No obvious venous abnormality within the limitations of this arterial phase study. Review of the MIP images confirms the above findings. NON-VASCULAR Hepatobiliary: Diffuse low-density throughout the liver compatible with fatty infiltration. No focal abnormality. Small gallstones within the gallbladder. Pancreas: No focal abnormality or ductal dilatation. Spleen: No focal abnormality.  Normal size. Adrenals/Urinary Tract: 3.4 cm exophytic cyst off the upper pole of the right kidney. Other smaller scattered right renal cysts. No hydronephrosis. Adrenal glands and urinary bladder unremarkable. Stomach/Bowel: Normal appendix. Stomach, large and small bowel grossly unremarkable. Lymphatic: No adenopathy Reproductive: Prostate enlargement Other: No free fluid or free air. Musculoskeletal: No acute bony abnormality. Review of the MIP images confirms the above findings. IMPRESSION: No evidence of aortic dissection. 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 Small hiatal hernia. Scattered coronary artery and aortic calcifications. Hepatic steatosis. Cholelithiasis. Prostate  enlargement. Electronically Signed   By: Charlett Nose M.D.   On: 11/15/2021 01:06   US Abdomen Limited RUQ (LIVER/GB)  Result Date: 11/15/2021 CLINICAL DATA:  Epigastric pain. EXAM: ULTRASOUND ABDOMEN LIMITED RIGHT UPPER QUADRANT COMPARISON:  None. FINDINGS: Gallbladder: There is gallstone. No gallbladder wall thickening or pericholecystic fluid. Negative sonographic Murphy's sign. Common bile duct: Diameter: 5 mm Liver: There is diffuse increased liver echogenicity most commonly seen in the setting of fatty infiltration. Superimposed inflammation or fibrosis is not excluded. Clinical correlation is recommended. Portal vein is patent on color Doppler imaging with normal direction of blood flow towards the liver. Other: None. IMPRESSION: 1. Cholelithiasis without sonographic evidence of acute cholecystitis.  2. Fatty liver. Electronically Signed   By: Elgie Collard M.D.   On: 11/15/2021 00:19     Assessment and Plan:   Epigastric pain - hs troponin x 2 negative - EKG with poor R wave progression, no old for comparison - low suspicion for ACS, suspect GI etiology   Atrial tachycardia PACs, atrial bigeminy - on BB at home, has not received here - I do not see bradycardia or significant pauses on telemetry - will check TSH, K 4.6, Mg pending - tachycardia in the setting of controlled pain - restart home atenolol - will not order a heart monitor for now - continue to monitor rhythm in telemetry after atenolol restarted   Hypertensive urgency - BP elevated to the 200s on arrival - he was started on a nitro drip for BP control - restart home medications and wean nitro   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    Risk Assessment/Risk Scores:         For questions or updates, please  contact [facility] Please consult [email or web address] for contact info under    Signed, Marcelino Duster, PA  11/15/2021 4:12 PM  Personally seen and examined. Agree with above.  71 year old here with hypertensive urgency.  Interesting finding on telemetry shows sinus rhythm, sinus rhythm with PACs, sinus rhythm with atrial bigeminy or PAC every other beat, atrial tachycardia.  No evidence of atrial fibrillation or flutter.  No ventricular tachycardia.  Atrial tachycardia is benign.  He is asymptomatic with this.  Atenolol was just administered.  He has not taken this for a couple days he states.  Continue to work on adequate blood pressure control.  No chest pain. EKG shows sinus rhythm with brief runs of atrial bigeminy.  Family history of heart disease with his father having CABG x5 in his 31s.  Agree with reinitiation of atenolol and other antihypertensives.  On exam alert and oriented x3 pleasant heart slightly tachycardic regular no murmurs lungs are clear.  Abdomen soft.  No edema.  Coronary artery calcification personally seen and interpreted on CT scan.  Recommend Crestor 20 mg in addition to his fenofibrate.  LDL goal less than 70.  We will go ahead and sign off.  Please let us know if we can be of further assistance.  Donato Schultz, MD

## 2021-11-15 NOTE — ED Notes (Signed)
Pt stated that his pain had subsided and he was doing well.  ?

## 2021-11-15 NOTE — Progress Notes (Signed)
Patient arrieved to [address] 24. Denies pain, Vitals entered, Md aware patient is here  ?

## 2021-11-15 NOTE — Assessment & Plan Note (Signed)
Concern for PUD.  ?-check h.pylori stool antigen. Has been on omeprazole 20mg , so can not do breath test ?-start protonix 40mg  IV ?-not on NSAIDs, limited caffeine. Has been under an immense amount of stress due to family related issues  ?

## 2021-11-15 NOTE — ED Provider Notes (Signed)
[facility] EMERGENCY DEPT Provider Note  CSN: [id number] Arrival date & time: 11/14/21 2306  Chief Complaint(s) Abdominal Pain  HPI Christopher Castillo is a 71 y.o. male with a past medical history of hypertension here for epigastric discomfort ongoing for 1 week.  Intermittent in nature.  Usually postprandial.  No alleviating factors.  No associated chest pain or shortness of breath.  Nonexertional.  Reports nausea without emesis.  No diarrhea.  No recent fevers or infections.  No coughing or congestion.  Patient has been compliant with his home medications including atenolol, fenofibrate, lisinopril, and Prilosec.  Denies any alcohol or illicit drug use.  No recent sinus congestion medication.    The history is provided by the patient.   Past Medical History Past Medical History:  Diagnosis Date   GERD (gastroesophageal reflux disease)    History of kidney stones    History of urinary tract infection    Hypertension    Patient Active Problem List   Diagnosis Date Noted   Hypertensive urgency 11/15/2021   Home Medication(s) Prior to Admission medications   Medication Sig Start Date End Date Taking? Authorizing Provider  aspirin EC 81 MG tablet Take 81 mg by mouth daily.    [provider]  atenolol (TENORMIN) 50 MG tablet Take 50 mg by mouth daily.    [provider]  fenofibrate (TRICOR) 145 MG tablet Take 145 mg by mouth daily.    [provider]  finasteride (PROSCAR) 5 MG tablet TAKE 1 TABLET BY MOUTH EVERY DAY 03/30/20   McKenzie, Mardene Celeste, MD  lisinopril (PRINIVIL,ZESTRIL) 40 MG tablet Take 40 mg by mouth daily.    [provider]  omeprazole (PRILOSEC) 20 MG capsule Take 20 mg by mouth daily.    [provider]                                                                                                                                    Allergies Patient has no known allergies.  Review of Systems Review of  Systems As noted in HPI  Physical Exam Vital Signs  I have reviewed the triage vital signs BP (!) 145/104   Pulse (!) 51   Temp 97.9 F (36.6 C) (Oral)   Resp 18   Ht 5\' 6"  (1.676 m)   Wt 74.4 kg   SpO2 92%   BMI 26.47 kg/m   Physical Exam Vitals reviewed.  Constitutional:      General: He is not in acute distress.    Appearance: He is well-developed. He is not diaphoretic.  HENT:     Head: Normocephalic and atraumatic.     Nose: Nose normal.  Eyes:     General: No scleral icterus.       Right eye: No discharge.        Left eye: No discharge.     Conjunctiva/sclera: Conjunctivae normal.     Pupils:  Pupils are equal, round, and reactive to light.  Cardiovascular:     Rate and Rhythm: Normal rate. Rhythm regularly irregular.     Heart sounds: No murmur heard.   No friction rub. No gallop.  Pulmonary:     Effort: Pulmonary effort is normal. No respiratory distress.     Breath sounds: Normal breath sounds. No stridor. No rales.  Abdominal:     General: There is no distension.     Palpations: Abdomen is soft.     Tenderness: There is abdominal tenderness (mild) in the epigastric area. There is no guarding or rebound. Negative signs include Murphy's sign and McBurney's sign.     Hernia: No hernia is present.  Musculoskeletal:        General: No tenderness.     Cervical back: Normal range of motion and neck supple.  Skin:    General: Skin is warm and dry.     Findings: No erythema or rash.  Neurological:     Mental Status: He is alert and oriented to person, place, and time.    ED Results and Treatments Labs (all labs ordered are listed, but only abnormal results are displayed) Labs Reviewed  COMPREHENSIVE METABOLIC PANEL - Abnormal; Notable for the following components:      Result Value   Glucose, Bld 139 (*)    AST 47 (*)    All other components within normal limits  URINALYSIS, ROUTINE W REFLEX MICROSCOPIC - Abnormal; Notable for the following components:    Color, Urine COLORLESS (*)    Protein, ur TRACE (*)    All other components within normal limits  RESP PANEL BY RT-PCR (FLU A&B, COVID) ARPGX2  LIPASE, BLOOD  CBC  TROPONIN I (HIGH SENSITIVITY)  TROPONIN I (HIGH SENSITIVITY)                                                                                                                         EKG  EKG Interpretation  Date/Time:  Wednesday November 14 2021 23:20:18 EST Ventricular Rate:  77 PR Interval:  152 QRS Duration: 90 QT Interval:  410 QTC Calculation: 464 R Axis:   -14 Text Interpretation: Sinus rhythm Atrial premature complex Abnormal R-wave progression, early transition No old tracing to compare Confirmed by Drema Pry [id number]) on 11/14/2021 11:39:12 PM       Radiology CT Angio Chest/Abd/Pel for Dissection W and/or Wo Contrast  Result Date: 11/15/2021 CLINICAL DATA:  Epigastric abdominal pain. Assess for dissection, AAA EXAM: CT ANGIOGRAPHY CHEST, ABDOMEN AND PELVIS TECHNIQUE: Non-contrast CT of the chest was initially obtained. Multidetector CT imaging through the chest, abdomen and pelvis was performed using the standard protocol during bolus administration of intravenous contrast. Multiplanar reconstructed images and MIPs were obtained and reviewed to evaluate the vascular anatomy. RADIATION DOSE REDUCTION: This exam was performed according to the departmental dose-optimization program which includes automated exposure control, adjustment of the mA and/or kV according to patient size and/or use of iterative reconstruction  technique. CONTRAST:  OMNIPAQUE IOHEXOL 350 MG/ML SOLN COMPARISON:  11/12/2016 FINDINGS: CTA CHEST FINDINGS Cardiovascular: Heart is normal size. Scattered coronary artery calcifications. No aneurysm or dissection. No filling defects in the pulmonary arteries to suggest pulmonary emboli. Mediastinum/Nodes: No mediastinal, hilar, or axillary adenopathy. Trachea and esophagus are unremarkable. Thyroid  unremarkable. Small hiatal hernia. Lungs/Pleura: Lungs are clear. No focal airspace opacities or suspicious nodules. No effusions. Musculoskeletal: Chest wall soft tissues are unremarkable. No acute bony abnormality. Review of the MIP images confirms the above findings. CTA ABDOMEN AND PELVIS FINDINGS VASCULAR Aorta: Scattered aortic calcifications. 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 No dissection. Celiac: Patent without evidence of aneurysm, dissection, vasculitis or significant stenosis. SMA: Patent without evidence of aneurysm, dissection, vasculitis or significant stenosis. Renals: Both renal arteries are patent without evidence of aneurysm, dissection, vasculitis, fibromuscular dysplasia or significant stenosis. IMA: Patent without evidence of aneurysm, dissection, vasculitis or significant stenosis. Inflow: Patent without evidence of aneurysm, dissection, vasculitis or significant stenosis. Veins: No obvious venous abnormality within the limitations of this arterial phase study. Review of the MIP images confirms the above findings. NON-VASCULAR Hepatobiliary: Diffuse low-density throughout the liver compatible with fatty infiltration. No focal abnormality. Small gallstones within the gallbladder. Pancreas: No focal abnormality or ductal dilatation. Spleen: No focal abnormality.  Normal size. Adrenals/Urinary Tract: 3.4 cm exophytic cyst off the upper pole of the right kidney. Other smaller scattered right renal cysts. No hydronephrosis. Adrenal glands and urinary bladder unremarkable. Stomach/Bowel: Normal appendix. Stomach, large and small bowel grossly unremarkable. Lymphatic: No adenopathy Reproductive: Prostate enlargement Other: No free fluid or free air. Musculoskeletal: No acute bony abnormality. Review of the MIP images confirms the above findings. IMPRESSION: No evidence of aortic dissection. 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 Small hiatal hernia. Scattered coronary artery and aortic calcifications. Hepatic steatosis. Cholelithiasis. Prostate enlargement. Electronically Signed   By: Charlett Nose M.D.   On: 11/15/2021 01:06   US Abdomen Limited RUQ (LIVER/GB)  Result Date: 11/15/2021 CLINICAL DATA:  Epigastric pain. EXAM: ULTRASOUND ABDOMEN LIMITED RIGHT UPPER QUADRANT COMPARISON:  None. FINDINGS: Gallbladder: There is gallstone. No gallbladder wall thickening or pericholecystic fluid. Negative sonographic Murphy's sign. Common bile duct: Diameter: 5 mm Liver: There is diffuse increased liver echogenicity most commonly seen in the setting of fatty infiltration. Superimposed inflammation or fibrosis is not excluded. Clinical correlation is recommended. Portal vein is patent on color Doppler imaging with normal direction of blood flow towards the liver. Other: None. IMPRESSION: 1. Cholelithiasis without sonographic evidence of acute cholecystitis. 2. Fatty liver. Electronically Signed   By: Elgie Collard M.D.   On: 11/15/2021 00:19    Pertinent labs & imaging results that were available during my care of the patient were reviewed by me and considered in my medical decision making (see MDM for details).  Medications Ordered in ED Medications  nitroGLYCERIN 50 mg in dextrose 5 % 250 mL (0.2 mg/mL) infusion (40 mcg/min Intravenous Rate/Dose Change 11/15/21 0327)  HYDROmorphone (DILAUDID) injection 1 mg (1 mg Intravenous Given 11/14/21 2357)  iohexol (OMNIPAQUE) 350 MG/ML injection 100 mL (100 mLs Intravenous Contrast Given 11/15/21 0044)  alum & mag hydroxide-simeth (MAALOX/MYLANTA) 200-200-20 MG/5ML suspension 30 mL (30 mLs Oral Given 11/15/21 0152)    And  lidocaine (XYLOCAINE) 2 % viscous mouth solution 15 mL (15 mLs Oral Given 11/15/21 0152)  Procedures .Critical  Care Performed by: Nira Conn, MD Authorized by: Nira Conn, MD   Critical care provider statement:    Critical care time (minutes):  45   Critical care time was exclusive of:  Separately billable procedures and treating other patients   Critical care was necessary to treat or prevent imminent or life-threatening deterioration of the following conditions:  Circulatory failure   Critical care was time spent personally by me on the following activities:  Development of treatment plan with patient or surrogate, discussions with consultants, evaluation of patient's response to treatment, examination of patient, obtaining history from patient or surrogate, review of old charts, re-evaluation of patient's condition, pulse oximetry, ordering and review of radiographic studies, ordering and review of laboratory studies and ordering and performing treatments and interventions   Care discussed with: admitting provider    (including critical care time)  Medical Decision Making / ED Course    Complexity of Problem:  Co morbidities that complicate the patient evaluation Noted in HPI  Additional history obtained: Care everywhere reviewed to get assessment of patient's baseline blood pressure but unable to see notes from PCP.  Patient's presenting problem/concern and DDX listed below: Epigastric pain GERD/dyspepsia, pancreatitis, biliary disease including cholecystitis, functional intestinal colic. Given patient's age and history of hypertension, will assess for atypical presentation of ACS. Given the patient's significant hypertension with systolics in the 200s, will rule out vascular process including dissection/AAA.     Complexity of Data:   Cardiac Monitoring: The patient was maintained on a cardiac monitor.   I personally viewed and interpreted the cardiac monitored which showed an underlying rhythm of normal sinus rhythm, with sinus arrhythmia ranging in rates from  the 50s to low 100s.   Laboratory Tests ordered listed below with my independent interpretation: CBC without leukocytosis or anemia No significant electrolyte derangements or renal sufficiency. Hyperglycemia without evidence of DKA No evidence of bili obstruction or pancreatitis EKG without acute ischemic changes or evidence of pericarditis.  Sinus arrhythmia noted. Troponins negative x2.     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    ED Course:    Based on above concerns, hospitalization possible: Yes  Assessment, Intervention, and Reassessment: Epigastric pain.  Pain improved with GI cocktail.  Favoring GERD/dyspepsia Unlikely ACS.  Ruled out for dissection.  Doubt PE, pancreatitis or acute cholecystitis. Hypertension Hypertension did not improve with pain relief. Patient started on nitroglycerin drip for hypertensive urgency. Consulted hospitalist service and spoke with Dr. Imogene Burn.  He agreed to admit patient for continued management. Arrhythmia. Patient does not have a cardiologist. Consulted cardiology.  Dr. Julianne Handler agreed to have the morning team consult on the patient while in the hospital to assess the arrhythmia and have patient establish care with them.  Final Clinical Impression(s) / ED Diagnoses Final diagnoses:  Epigastric abdominal pain  Hypertensive urgency           This chart was dictated using voice recognition software.  Despite best efforts to proofread,  errors can occur which can change the documentation meaning.    Nira Conn, MD 11/15/21 [phone]

## 2021-11-16 DIAGNOSIS — I160 Hypertensive urgency: Secondary | ICD-10-CM | POA: Diagnosis not present

## 2021-11-16 LAB — COMPREHENSIVE METABOLIC PANEL
ALT: 23 U/L (ref 0–44)
AST: 22 U/L (ref 15–41)
Albumin: 4 g/dL (ref 3.5–5.0)
Alkaline Phosphatase: 54 U/L (ref 38–126)
Anion gap: 11 (ref 5–15)
BUN: 21 mg/dL (ref 8–23)
CO2: 24 mmol/L (ref 22–32)
Calcium: 9.2 mg/dL (ref 8.9–10.3)
Chloride: 102 mmol/L (ref 98–111)
Creatinine, Ser: 1 mg/dL (ref 0.61–1.24)
GFR, Estimated: >60 mL/min (ref >60)
Glucose, Bld: 131 mg/dL — ABNORMAL HIGH (ref 70–99)
Potassium: 3.7 mmol/L (ref 3.5–5.1)
Sodium: 137 mmol/L (ref 135–145)
Total Bilirubin: 0.9 mg/dL (ref 0.3–1.2)
Total Protein: 7.2 g/dL (ref 6.5–8.1)

## 2021-11-16 MED ORDER — ROSUVASTATIN CALCIUM 20 MG PO TABS: 20.0000 mg | ORAL_TABLET | Freq: Every day | ORAL | 2 refills | Status: DC

## 2021-11-16 MED ORDER — HYDROCHLOROTHIAZIDE 12.5 MG PO TABS: 12.5000 mg | ORAL_TABLET | Freq: Every day | ORAL | 2 refills | Status: DC

## 2021-11-16 MED ORDER — ROSUVASTATIN CALCIUM 20 MG PO TABS
20.0000 mg | ORAL_TABLET | Freq: Every day | ORAL | Status: DC
  Administered 2021-11-16: 20 mg via ORAL
  Filled 2021-11-16: qty 1

## 2021-11-16 MED ORDER — ALUM & MAG HYDROXIDE-SIMETH 200-200-20 MG/5ML PO SUSP: 30.0000 mL | Freq: Four times a day (QID) | ORAL | 0 refills | Status: AC | PRN

## 2021-11-16 NOTE — Discharge Summary (Signed)
Physician Discharge Summary  Jakin Pavao ELF:[id number] DOB: [birth year] DOA: 11/14/2021  PCP: Haywood Pao, MD  Admit date: 11/14/2021 Discharge date: 11/16/2021  Admitted From: Home Discharge disposition: Home  Recommendations at discharge:  Follow-up with GI as an outpatient.  Ambulatory referral given. HCTZ 12.5 mg daily added to your blood pressure regimen.  Continue to monitor blood pressure at home.  Follow-up with PCP for further adjustment.  Brief narrative: Christopher Castillo is a 71 y.o. male with PMH significant for HTN, HLD, GERD, BPH, nephrolithiasis. Patient presented to the ED on 11/14/2021 with progressively worsening intermittent epigastric pain for a week especially with greasy food.  In the ED, he was afebrile, blood pressure significantly elevated to 220/113. Labs nonsignificant. Right upper quadrant ultrasound with cholelithiasis but without acute cholecystitis.  Fatty liver. CT angio chest and pelvis did not show any evidence of dissection, it showed mild focal dilatation of the infrarenal abdominal aorta measuring up to 2.7 cm with mural thrombus. Patient significantly elevated blood pressure, cardiologist consulted and patient was started on nitroglycerin drip Admitted to hospitalist service  Subjective: Patient was seen and examined this morning.  Pleasant elderly Caucasian male. Sitting up in bed.  Not in distress.  No new symptoms.  Feels better.  Wife at bedside.  Principal Problem:   Hypertensive urgency Active Problems:   Epigastric pain   Cholelithiasis   Gastro-esophageal reflux disease without esophagitis   Benign prostatic hyperplasia with lower urinary tract symptoms   Dilation of aorta [facility])     Hospital course: Hypertensive urgency -Blood pressure was over 200 at presentation. -Complained of epigastric pain but no chest pain.  Troponin negative x2.  EKG with poor R wave progression.  No neurological deficits. -Patient was started on  nitroglycerin drip in the ED with gradual improvement in blood pressure.  Off nitroglycerin drip since 4 PM yesterday afternoon.  Blood pressure overnight mostly in 150s. -PTA, patient was on atenolol 50 mg daily, lisinopril 40 mg daily. Both have been resumed.  Patient continues to have elevated blood pressure in 150s and 160s.  HCTZ 12.5 mg daily added.  Discussed with patient to continue monitoring blood pressure at home and follow-up with his PCP for further adjustment.   Atrial tachycardia with PACs Hypokalemia -Noted in telemetry in the ED.  Improved after atenolol was resumed and potassium replaced. -TSH normal. -Cardiology consult appreciated. -Per cardiology, no need of heart monitor for now.  Epigastric pain GERD -Concern for PUD.  -PTA, patient was on omeprazole.  Continue the same.  Also recommend as needed Tums.   Ambulatory referral to GI given.  May need endoscopy as an outpatient.  Cholelithiasis -No evidence of infection/obstruction or need for urgent intervention.  -Slightly elevated AST that improved on subsequent labs. 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 -Blood pressure and cholesterol control. -Continue aspirin 81 mg daily, fenofibrate. -Follow-up as an outpatient for ultrasound abdomen every 6 months.  Wounds:  -    Discharge Exam:   Vitals:   11/15/21 2322 11/16/21 0520 11/16/21 0756 11/16/21 1102  BP: (!) 148/81 Marland Kitchen)  158/95 (!) 164/98 (!) 168/98  Pulse: 83 85 93 73  Resp: 18 18 18 17   Temp: 99.9 F (37.7 C) 99.4 F (37.4 C) 98.2 F (36.8 C) 98.5 F (36.9 C)  TempSrc: Oral Oral  Oral Oral  SpO2: 97% 98% 98% 98%  Weight:      Height:        Body mass index is 26.05 kg/m.  General exam: Pleasant, elderly Caucasian male.  Not in physical distress Skin: No rashes, lesions or ulcers. HEENT: Atraumatic, normocephalic, no obvious bleeding Lungs: Clear to auscultation bilaterally CVS: Regular rate and rhythm, no murmur GI/Abd soft, nontender, nondistended, bowel sound present CNS: Alert, awake, oriented x3 Psychiatry: Mood appropriate Extremities: No pedal edema, no calf tenderness  Follow ups:    Follow-up Information     Tisovec, Fransico Him, MD Follow up.   Specialty: Internal Medicine Contact information: [place] [address] [phone]         Jerline Pain, MD .   Specialty: Cardiology Contact information: [id number] [place] [address] [phone]         Jackquline Denmark, MD Follow up.   Specialties: Gastroenterology, Internal Medicine Contact information: [address] [place] [address] [phone]                 Discharge Instructions:   Discharge Instructions     Call MD for:  difficulty breathing, headache or visual disturbances   Complete by: As directed    Call MD for:  extreme fatigue   Complete by: As directed    Call MD for:  hives   Complete by: As directed    Call MD for:  persistant dizziness or light-headedness   Complete by: As directed    Call MD for:  persistant nausea and vomiting   Complete by: As directed    Call MD for:  severe uncontrolled pain   Complete by: As directed    Call MD for:  temperature >100.4   Complete by: As directed    Diet - low sodium heart healthy   Complete by: As directed    Discharge instructions   Complete by: As directed    Recommendations at discharge:   Follow-up with GI as an outpatient.  Ambulatory referral given.  HCTZ 12.5 mg daily added to your blood pressure regimen.  Continue to  monitor blood pressure at home.  Follow-up with PCP for further adjustment.   General discharge instructions: Follow with Primary MD Tisovec, Fransico Him, MD in 7 days  Please request your PCP  to go over your hospital tests, procedures, radiology results at the follow up. Please get your medicines reviewed and adjusted.  Your PCP may decide to repeat certain labs or tests as needed. Do not drive, operate heavy machinery, perform activities at heights, swimming or participation in water activities or provide baby sitting services if your were admitted for syncope or siezures until you have seen by Primary MD or a Neurologist and advised to do so again. [place] Controlled Substance Reporting System database was reviewed. Do not drive, operate heavy machinery, perform activities at heights, swim, participate in water activities or provide baby-sitting services while on medications for pain, sleep and mood until your outpatient physician has reevaluated you and advised to do so again.  You are strongly recommended to comply with the dose, frequency and duration of prescribed medications. Activity: As tolerated with Full fall precautions use  walker/cane & assistance as needed Avoid using any recreational substances like cigarette, tobacco, alcohol, or non-prescribed drug. If you experience worsening of your admission symptoms, develop shortness of breath, life threatening emergency, suicidal or homicidal thoughts you must seek medical attention immediately by calling 911 or calling your MD immediately  if symptoms less severe. You must read complete instructions/literature along with all the possible adverse reactions/side effects for all the medicines you take and that have been prescribed to you. Take any new medicine only after you have completely understood and accepted all the possible adverse reactions/side effects.  Wear Seat belts while driving. You were cared for by a hospitalist during your  hospital stay. If you have any questions about your discharge medications or the care you received while you were in the hospital after you are discharged, you can call the unit and ask to speak with the hospitalist or the covering physician. Once you are discharged, your primary care physician will handle any further medical issues. Please note that NO REFILLS for any discharge medications will be authorized once you are discharged, as it is imperative that you return to your primary care physician (or establish a relationship with a primary care physician if you do not have one).   Increase activity slowly   Complete by: As directed        Discharge Medications:   Allergies as of 11/16/2021   No Known Allergies      Medication List     TAKE these medications    alum & mag hydroxide-simeth 200-200-20 MG/5ML suspension Commonly known as: Mylanta Take 30 mLs by mouth every 6 (six) hours as needed for up to 7 days for indigestion or heartburn.   aspirin EC 81 MG tablet Take 81 mg by mouth daily.   atenolol 50 MG tablet Commonly known as: TENORMIN Take 50 mg by mouth daily.   fenofibrate 145 MG tablet Commonly known as: TRICOR Take 145 mg by mouth daily.   finasteride 5 MG tablet Commonly known as: PROSCAR TAKE 1 TABLET BY MOUTH EVERY DAY   hydrochlorothiazide 12.5 MG tablet Commonly known as: HYDRODIURIL Take 1 tablet (12.5 mg total) by mouth daily.   lisinopril 40 MG tablet Commonly known as: ZESTRIL Take 40 mg by mouth daily.   multivitamin tablet Take 1 tablet by mouth daily.   omeprazole 20 MG capsule Commonly known as: PRILOSEC Take 20 mg by mouth daily.   rosuvastatin 20 MG tablet Commonly known as: CRESTOR Take 1 tablet (20 mg total) by mouth daily. Start taking on: November 17, 2021         The results of significant diagnostics from this hospitalization (including imaging, microbiology, ancillary and laboratory) are listed below for reference.     Procedures and Diagnostic Studies:   DG CHEST PORT 1 VIEW  Result Date: 11/15/2021 CLINICAL DATA:  Shortness of breath. EXAM: PORTABLE CHEST 1 VIEW COMPARISON:  CTA chest from same day. FINDINGS: The heart size and mediastinal contours are within normal limits. Both lungs are clear. The visualized skeletal structures are unremarkable. IMPRESSION: No active disease. Electronically Signed   By: Titus Dubin M.D.   On: 11/15/2021 16:35   CT Angio Chest/Abd/Pel for Dissection W and/or Wo Contrast  Result Date: 11/15/2021 CLINICAL DATA:  Epigastric abdominal pain. Assess for dissection, AAA EXAM: CT ANGIOGRAPHY CHEST, ABDOMEN AND PELVIS TECHNIQUE: Non-contrast CT of the chest was initially obtained. Multidetector CT imaging through the chest, abdomen and pelvis was performed using the standard protocol  during bolus administration of intravenous contrast. Multiplanar reconstructed images and MIPs were obtained and reviewed to evaluate the vascular anatomy. RADIATION DOSE REDUCTION: This exam was performed according to the departmental dose-optimization program which includes automated exposure control, adjustment of the mA and/or kV according to patient size and/or use of iterative reconstruction technique. CONTRAST:  115mL OMNIPAQUE IOHEXOL 350 MG/ML SOLN COMPARISON:  11/12/2016 FINDINGS: CTA CHEST FINDINGS Cardiovascular: Heart is normal size. Scattered coronary artery calcifications. No aneurysm or dissection. No filling defects in the pulmonary arteries to suggest pulmonary emboli. Mediastinum/Nodes: No mediastinal, hilar, or axillary adenopathy. Trachea and esophagus are unremarkable. Thyroid unremarkable. Small hiatal hernia. Lungs/Pleura: Lungs are clear. No focal airspace opacities or suspicious nodules. No effusions. Musculoskeletal: Chest wall soft tissues are unremarkable. No acute bony abnormality. Review of the MIP images confirms the above findings. CTA ABDOMEN AND PELVIS FINDINGS VASCULAR  Aorta: Scattered aortic calcifications. 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 No dissection. Celiac: Patent without evidence of aneurysm, dissection, vasculitis or significant stenosis. SMA: Patent without evidence of aneurysm, dissection, vasculitis or significant stenosis. Renals: Both renal arteries are patent without evidence of aneurysm, dissection, vasculitis, fibromuscular dysplasia or significant stenosis. IMA: Patent without evidence of aneurysm, dissection, vasculitis or significant stenosis. Inflow: Patent without evidence of aneurysm, dissection, vasculitis or significant stenosis. Veins: No obvious venous abnormality within the limitations of this arterial phase study. Review of the MIP images confirms the above findings. NON-VASCULAR Hepatobiliary: Diffuse low-density throughout the liver compatible with fatty infiltration. No focal abnormality. Small gallstones within the gallbladder. Pancreas: No focal abnormality or ductal dilatation. Spleen: No focal abnormality.  Normal size. Adrenals/Urinary Tract: 3.4 cm exophytic cyst off the upper pole of the right kidney. Other smaller scattered right renal cysts. No hydronephrosis. Adrenal glands and urinary bladder unremarkable. Stomach/Bowel: Normal appendix. Stomach, large and small bowel grossly unremarkable. Lymphatic: No adenopathy Reproductive: Prostate enlargement Other: No free fluid or free air. Musculoskeletal: No acute bony abnormality. Review of the MIP images confirms the above findings. IMPRESSION: No evidence of aortic dissection. 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 Small hiatal hernia. Scattered coronary artery and aortic calcifications. Hepatic steatosis. Cholelithiasis. Prostate enlargement. Electronically Signed   By: Rolm Baptise M.D.   On: 11/15/2021 01:06   US Abdomen Limited RUQ (LIVER/GB)  Result Date: 11/15/2021 CLINICAL DATA:   Epigastric pain. EXAM: ULTRASOUND ABDOMEN LIMITED RIGHT UPPER QUADRANT COMPARISON:  None. FINDINGS: Gallbladder: There is gallstone. No gallbladder wall thickening or pericholecystic fluid. Negative sonographic Murphy's sign. Common bile duct: Diameter: 5 mm Liver: There is diffuse increased liver echogenicity most commonly seen in the setting of fatty infiltration. Superimposed inflammation or fibrosis is not excluded. Clinical correlation is recommended. Portal vein is patent on color Doppler imaging with normal direction of blood flow towards the liver. Other: None. IMPRESSION: 1. Cholelithiasis without sonographic evidence of acute cholecystitis. 2. Fatty liver. Electronically Signed   By: Anner Crete M.D.   On: 11/15/2021 00:19     Labs:   Basic Metabolic Panel: Recent Labs  Lab 11/14/21 2323 11/15/21 1624 11/16/21 0205  NA 141 139 137  K 4.6 3.3* 3.7  CL 104 104 102  CO2 26 23 24   GLUCOSE 139* 142* 131*  BUN 22 22 21   CREATININE 1.14 1.16 1.00  CALCIUM 9.5 9.1 9.2  MG  --  2.0  --    GFR Estimated Creatinine Clearance: 63.8 mL/min (by C-G formula  based on SCr of 1 mg/dL). Liver Function Tests: Recent Labs  Lab 11/14/21 2323 11/15/21 1624 11/16/21 0205  AST 47* 30 22  ALT 32 28 23  ALKPHOS 57 56 54  BILITOT 0.8 0.8 0.9  PROT 8.0 7.3 7.2  ALBUMIN 4.8 4.1 4.0   Recent Labs  Lab 11/14/21 2323  LIPASE 22   No results for input(s): AMMONIA in the last 168 hours. Coagulation profile No results for input(s): INR, PROTIME in the last 168 hours.  CBC: Recent Labs  Lab 11/14/21 2323  WBC 10.4  HGB 16.8  HCT 50.0  MCV 86.4  PLT 199   Cardiac Enzymes: No results for input(s): CKTOTAL, CKMB, CKMBINDEX, TROPONINI in the last 168 hours. BNP: Invalid input(s): POCBNP CBG: No results for input(s): GLUCAP in the last 168 hours. D-Dimer No results for input(s): DDIMER in the last 72 hours. Hgb A1c No results for input(s): HGBA1C in the last 72 hours. Lipid  Profile No results for input(s): CHOL, HDL, LDLCALC, TRIG, CHOLHDL, LDLDIRECT in the last 72 hours. Thyroid function studies Recent Labs    11/15/21 1624  TSH 0.742   Anemia work up No results for input(s): VITAMINB12, FOLATE, FERRITIN, TIBC, IRON, RETICCTPCT in the last 72 hours. Microbiology Recent Results (from the past 240 hour(s))  Resp Panel by RT-PCR (Flu A&B, Covid) Nasopharyngeal Swab     Status: None   Collection Time: 11/14/21 11:23 PM   Specimen: Nasopharyngeal Swab; Nasopharyngeal(NP) swabs in vial transport medium  Result Value Ref Range Status   SARS Coronavirus 2 by RT PCR NEGATIVE NEGATIVE Final    Comment: (NOTE) SARS-CoV-2 target nucleic acids are NOT DETECTED.  The SARS-CoV-2 RNA is generally detectable in upper respiratory specimens during the acute phase of infection. The lowest concentration of SARS-CoV-2 viral copies this assay can detect is 138 copies/mL. A negative result does not preclude SARS-Cov-2 infection and should not be used as the sole basis for treatment or other patient management decisions. A negative result may occur with  improper specimen collection/handling, submission of specimen other than nasopharyngeal swab, presence of viral mutation(s) within the areas targeted by this assay, and inadequate number of viral copies(<138 copies/mL). A negative result must be combined with clinical observations, patient history, and epidemiological information. The expected result is Negative.  Fact Sheet for Patients:  EntrepreneurPulse.com.au  Fact Sheet for Healthcare Providers:  IncredibleEmployment.be  This test is no t yet approved or cleared by the [place] FDA and  has been authorized for detection and/or diagnosis of SARS-CoV-2 by FDA under an Emergency Use Authorization (EUA). This EUA will remain  in effect (meaning this test can be used) for the duration of the COVID-19 declaration under Section  564(b)(1) of the Act, 21 U.S.C.section 360bbb-3(b)(1), unless the authorization is terminated  or revoked sooner.       Influenza A by PCR NEGATIVE NEGATIVE Final   Influenza B by PCR NEGATIVE NEGATIVE Final    Comment: (NOTE) The Xpert Xpress SARS-CoV-2/FLU/RSV plus assay is intended as an aid in the diagnosis of influenza from Nasopharyngeal swab specimens and should not be used as a sole basis for treatment. Nasal washings and aspirates are unacceptable for Xpert Xpress SARS-CoV-2/FLU/RSV testing.  Fact Sheet for Patients: EntrepreneurPulse.com.au  Fact Sheet for Healthcare Providers: IncredibleEmployment.be  This test is not yet approved or cleared by the [place] FDA and has been authorized for detection and/or diagnosis of SARS-CoV-2 by FDA under an Emergency Use Authorization (EUA). This EUA will remain  in effect (meaning this test can be used) for the duration of the COVID-19 declaration under Section 564(b)(1) of the Act, 21 U.S.C. section 360bbb-3(b)(1), unless the authorization is terminated or revoked.  Performed at [facility], [address], [place], [place] [address]     Time coordinating discharge: 35 minutes  Signed: Marlowe Aschoff Renna Kilmer  [facility] 11/16/2021, 2:52 PM

## 2021-12-10 DIAGNOSIS — Z125 Encounter for screening for malignant neoplasm of prostate: Secondary | ICD-10-CM | POA: Diagnosis not present

## 2021-12-10 DIAGNOSIS — E781 Pure hyperglyceridemia: Secondary | ICD-10-CM | POA: Diagnosis not present

## 2021-12-10 DIAGNOSIS — I10 Essential (primary) hypertension: Secondary | ICD-10-CM | POA: Diagnosis not present

## 2021-12-17 DIAGNOSIS — Z1331 Encounter for screening for depression: Secondary | ICD-10-CM | POA: Diagnosis not present

## 2021-12-17 DIAGNOSIS — I10 Essential (primary) hypertension: Secondary | ICD-10-CM | POA: Diagnosis not present

## 2021-12-17 DIAGNOSIS — K219 Gastro-esophageal reflux disease without esophagitis: Secondary | ICD-10-CM | POA: Diagnosis not present

## 2021-12-17 DIAGNOSIS — Z0000 Encounter for general adult medical examination without abnormal findings: Secondary | ICD-10-CM | POA: Diagnosis not present

## 2021-12-17 DIAGNOSIS — R82998 Other abnormal findings in urine: Secondary | ICD-10-CM | POA: Diagnosis not present

## 2021-12-17 DIAGNOSIS — R972 Elevated prostate specific antigen [PSA]: Secondary | ICD-10-CM | POA: Diagnosis not present

## 2021-12-17 DIAGNOSIS — Z1339 Encounter for screening examination for other mental health and behavioral disorders: Secondary | ICD-10-CM | POA: Diagnosis not present

## 2021-12-17 DIAGNOSIS — Z1212 Encounter for screening for malignant neoplasm of rectum: Secondary | ICD-10-CM | POA: Diagnosis not present

## 2021-12-17 DIAGNOSIS — L570 Actinic keratosis: Secondary | ICD-10-CM | POA: Diagnosis not present

## 2021-12-17 DIAGNOSIS — E781 Pure hyperglyceridemia: Secondary | ICD-10-CM | POA: Diagnosis not present

## 2021-12-17 DIAGNOSIS — E663 Overweight: Secondary | ICD-10-CM | POA: Diagnosis not present

## 2021-12-17 DIAGNOSIS — N401 Enlarged prostate with lower urinary tract symptoms: Secondary | ICD-10-CM | POA: Diagnosis not present

## 2022-01-03 DIAGNOSIS — H35033 Hypertensive retinopathy, bilateral: Secondary | ICD-10-CM | POA: Diagnosis not present

## 2022-01-03 DIAGNOSIS — D3132 Benign neoplasm of left choroid: Secondary | ICD-10-CM | POA: Diagnosis not present

## 2022-01-03 DIAGNOSIS — H35361 Drusen (degenerative) of macula, right eye: Secondary | ICD-10-CM | POA: Diagnosis not present

## 2022-01-03 DIAGNOSIS — H34832 Tributary (branch) retinal vein occlusion, left eye, with macular edema: Secondary | ICD-10-CM | POA: Diagnosis not present

## 2022-01-31 ENCOUNTER — Encounter: Payer: Self-pay | Admitting: Cardiology

## 2022-01-31 ENCOUNTER — Ambulatory Visit: Payer: PPO | Admitting: Cardiology

## 2022-01-31 VITALS — BP 160/80 | HR 69 | Ht 66.0 in | Wt 170.0 lb

## 2022-01-31 DIAGNOSIS — I2584 Coronary atherosclerosis due to calcified coronary lesion: Secondary | ICD-10-CM

## 2022-01-31 DIAGNOSIS — I2510 Atherosclerotic heart disease of native coronary artery without angina pectoris: Secondary | ICD-10-CM

## 2022-01-31 DIAGNOSIS — I10 Essential (primary) hypertension: Secondary | ICD-10-CM

## 2022-01-31 MED ORDER — AMLODIPINE BESYLATE 5 MG PO TABS: 5.0000 mg | ORAL_TABLET | Freq: Every day | ORAL | 3 refills | Status: DC

## 2022-01-31 MED ORDER — HYDROCHLOROTHIAZIDE 12.5 MG PO TABS: 12.5000 mg | ORAL_TABLET | Freq: Every day | ORAL | 3 refills | Status: DC

## 2022-01-31 NOTE — Assessment & Plan Note (Signed)
Father had bypass surgery at 89.  He has coronary artery calcification seen on CT scan.  Encouraged statin use however he is having some difficulties, feeling sluggish on the Crestor.  We will give him Crestor holiday.  After 2 weeks if symptoms resolve, he can start Crestor at 10 mg and see how he feels.  I will also have him sit down with our pharmacy clinic to monitor his blood pressure as well.  If necessary, a statin alternative may be helpful.

## 2022-01-31 NOTE — Progress Notes (Signed)
Cardiology Office Note:    Date:  01/31/2022   ID:  Christopher Castillo, DOB 01/01/1951, MRN [id number]  PCP:  Haywood Pao, MD   [facility] Providers Cardiologist:  Candee Furbish, MD     Referring MD: Haywood Pao, MD   History of Present Illness:    Christopher Castillo is a 71 y.o. male here for the evaluation of hypertension at the request of Dr. Osborne Casco.  Referral notes from Dr. Osborne Casco personally reviewed. At their visit 12/17/21 his blood pressure had been elevated per recordings at home but he was asymptomatic. His lisinopril was switched to olmesartan 40 mg daily. He was encouraged to monitor his at home BP and was referred to cardiology.  He was admitted to the hospital 11/14/2021 with a BP of over 742 systolic on presentation. He had epigastric pain but no chest pain. Troponin was negative x2, and EKG showed poor R wave progression. There was gradual improvement in BP with nitroglycerin drip. Atenolol 50 mg daily and lisinopril 40 mg daily were resumed, but he continued to have blood pressures in the 150's and 160's. HCTZ 12.5 mg daily was added. Atrial tachycardia with PAC's noted in telemetry, improved after atenolol was resumed and potassium replaced. Per cardiology, no need for heart monitor at that time.  Today: He is accompanied by his wife. He notes that his blood pressure has always been on the high side, usually in the 150's. Atenolol has been part of his regimen for a long time. They monitored his BP for a month after discharge from the hospital, with readings a bit lower such as 138/70s-80s.  He was started on rosuvastatin in the hospital, and he has not felt well since. He was instructed to keep taking it until today's consult. It is difficult for him to describe, as he is still able to complete his ADL's and he is not necessarily in pain. His wife notes that he seems to develop pallor of his skin at times.  Typically he stays active. Usually with mowing the lawn or other  yard work. Their diet is fairly healthy, including roasted vegetables and chicken with no added salt. He notes that he drinks about 6 oz of orange juice a day.  He denies any palpitations, chest pain, shortness of breath, or peripheral edema. No lightheadedness, headaches, syncope, orthopnea, or PND.   Past Medical History:  Diagnosis Date   GERD (gastroesophageal reflux disease)    History of kidney stones    History of urinary tract infection    Hypertension     Past Surgical History:  Procedure Laterality Date   colonscopy     EXTRACORPOREAL SHOCK WAVE LITHOTRIPSY Right 12/12/2016   Procedure: RIGHT EXTRACORPOREAL SHOCK WAVE LITHOTRIPSY (ESWL);  Surgeon: Cleon Gustin, MD;  Location: [facility];  Service: Urology;  Laterality: Right;    Current Medications: Current Meds  Medication Sig   amLODipine (NORVASC) 5 MG tablet Take 1 tablet (5 mg total) by mouth daily.   aspirin EC 81 MG tablet Take 81 mg by mouth daily.   atenolol (TENORMIN) 50 MG tablet Take 50 mg by mouth daily.   fenofibrate (TRICOR) 145 MG tablet Take 145 mg by mouth daily.   finasteride (PROSCAR) 5 MG tablet TAKE 1 TABLET BY MOUTH EVERY DAY   olmesartan (BENICAR) 40 MG tablet Take 40 mg by mouth daily.   omeprazole (PRILOSEC) 20 MG capsule Take 20 mg by mouth daily.   rosuvastatin (CRESTOR) 20 MG tablet Take 1 tablet (  20 mg total) by mouth daily.   tamsulosin (FLOMAX) 0.4 MG CAPS capsule Take 0.4 mg by mouth daily.   [DISCONTINUED] hydrochlorothiazide (HYDRODIURIL) 12.5 MG tablet Take 1 tablet (12.5 mg total) by mouth daily.     Allergies:   Patient has no known allergies.   Social History   Socioeconomic History   Marital status: Married    Spouse name: Not on file   Number of children: Not on file   Years of education: Not on file   Highest education level: Not on file  Occupational History   Not on file  Tobacco Use   Smoking status: Never   Smokeless tobacco: Never  Substance and Sexual  Activity   Alcohol use: No   Drug use: No   Sexual activity: Not on file  Other Topics Concern   Not on file  Social History Narrative   Not on file   Social Determinants of Health   Financial Resource Strain: Not on file  Food Insecurity: Not on file  Transportation Needs: Not on file  Physical Activity: Not on file  Stress: Not on file  Social Connections: Not on file     Family History: The patient's family history is not on file.  ROS:   Please see the history of present illness.    All other systems reviewed and are negative.  EKGs/Labs/Other Studies Reviewed:    The following studies were reviewed today:  CTA Chest/Abdomen/Pelvis  11/15/2021: FINDINGS: CTA CHEST FINDINGS   Cardiovascular: Heart is normal size. Scattered coronary artery calcifications. No aneurysm or dissection. No filling defects in the pulmonary arteries to suggest pulmonary emboli.   Mediastinum/Nodes: No mediastinal, hilar, or axillary adenopathy. Trachea and esophagus are unremarkable. Thyroid unremarkable. Small hiatal hernia.   Lungs/Pleura: Lungs are clear. No focal airspace opacities or suspicious nodules. No effusions.   Musculoskeletal: Chest wall soft tissues are unremarkable. No acute bony abnormality.   Review of the MIP images confirms the above findings.   CTA ABDOMEN AND PELVIS FINDINGS   VASCULAR   Aorta: Scattered aortic calcifications. 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 No dissection.   Celiac: Patent without evidence of aneurysm, dissection, vasculitis or significant stenosis.   SMA: Patent without evidence of aneurysm, dissection, vasculitis or significant stenosis.   Renals: Both renal arteries are patent without evidence of aneurysm, dissection, vasculitis, fibromuscular dysplasia or significant stenosis.   IMA: Patent without evidence of aneurysm, dissection, vasculitis or significant stenosis.   Inflow: Patent  without evidence of aneurysm, dissection, vasculitis or significant stenosis.   Veins: No obvious venous abnormality within the limitations of this arterial phase study.   Review of the MIP images confirms the above findings.   NON-VASCULAR   Hepatobiliary: Diffuse low-density throughout the liver compatible with fatty infiltration. No focal abnormality. Small gallstones within the gallbladder.   Pancreas: No focal abnormality or ductal dilatation.   Spleen: No focal abnormality.  Normal size.   Adrenals/Urinary Tract: 3.4 cm exophytic cyst off the upper pole of the right kidney. Other smaller scattered right renal cysts. No hydronephrosis. Adrenal glands and urinary bladder unremarkable.   Stomach/Bowel: Normal appendix. Stomach, large and small bowel grossly unremarkable.   Lymphatic: No adenopathy   Reproductive: Prostate enlargement   Other: No free fluid or free air.   Musculoskeletal: No acute bony abnormality.   Review of the MIP images confirms the above findings.   IMPRESSION: No evidence of aortic dissection.   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   Small hiatal hernia.   Scattered coronary artery and aortic calcifications.   Hepatic steatosis.   Cholelithiasis.   Prostate enlargement.   EKG:  EKG is personally reviewed and interpreted. 01/31/2022: EKG was not ordered.   Recent Labs: 11/14/2021: Hemoglobin 16.8; Platelets 199 11/15/2021: B Natriuretic Peptide 72.7; Magnesium 2.0; TSH 0.742 11/16/2021: ALT 23; BUN 21; Creatinine, Ser 1.00; Potassium 3.7; Sodium 137   Recent Lipid Panel No results found for: CHOL, TRIG, HDL, CHOLHDL, VLDL, LDLCALC, LDLDIRECT   Risk Assessment/Calculations:          Physical Exam:    VS:  BP (!) 160/80 (BP Location: Left Arm, Patient Position: Sitting, Cuff Size: Normal)   Pulse 69   Ht '5\' 6"'$  (1.676 m)   Wt 170 lb (77.1 kg)   SpO2 95%   BMI  27.44 kg/m     Wt Readings from Last 3 Encounters:  01/31/22 170 lb (77.1 kg)  11/15/21 161 lb 6 oz (73.2 kg)  12/12/16 160 lb 8 oz (72.8 kg)     GEN: Well nourished, well developed in no acute distress HEENT: Normal NECK: No JVD; No carotid bruits LYMPHATICS: No lymphadenopathy CARDIAC: RRR, no murmurs, rubs, gallops RESPIRATORY:  Clear to auscultation without rales, wheezing or rhonchi  ABDOMEN: Soft, non-tender, non-distended MUSCULOSKELETAL:  No edema; No deformity  SKIN: Warm and dry NEUROLOGIC:  Alert and oriented x 3 PSYCHIATRIC:  Normal affect   ASSESSMENT:    1. Primary hypertension   2. Coronary artery calcification    PLAN:    In order of problems listed above:  Coronary artery calcification Father had bypass surgery at 26.  He has coronary artery calcification seen on CT scan.  Encouraged statin use however he is having some difficulties, feeling sluggish on the Crestor.  We will give him Crestor holiday.  After 2 weeks if symptoms resolve, he can start Crestor at 10 mg and see how he feels.  I will also have him sit down with our pharmacy clinic to monitor his blood pressure as well.  If necessary, a statin alternative may be helpful.  Primary hypertension CT scan personally reviewed demonstrates no evidence of adrenal mass and no evidence of renal artery stenosis.  We are adding amlodipine 5 mg once a day. Continue with olmesartan 40 mg Continue with atenolol 50 mg once a day.  If necessary in the future we could switch this over to carvedilol.  Continue with HCTZ 12.5 mg once a day.       Follow-up: 1-2 months with HTN Clinic. 6 months with APP.  Medication Adjustments/Labs and Tests Ordered: Current medicines are reviewed at length with the patient today.  Concerns regarding medicines are outlined above.   Orders Placed This Encounter  Procedures   AMB Referral to [facility]care Pharm-D   Meds ordered this encounter  Medications   amLODipine (NORVASC)  5 MG tablet    Sig: Take 1 tablet (5 mg total) by mouth daily.    Dispense:  90 tablet    Refill:  3   hydrochlorothiazide (HYDRODIURIL) 12.5 MG tablet    Sig: Take 1 tablet (12.5 mg total) by mouth daily.    Dispense:  90 tablet    Refill:  3   Patient Instructions  Medication Instructions:  Please start Amlodipine 5 mg once a day. Continue all other medications as listed.  *If you need a refill on your  cardiac medications before your next appointment, please call your pharmacy*  You have been referred to our Hypertension Clinic to follow up in 1 to 2 months.  Follow-Up: At [facility], you and your health needs are our priority.  As part of our continuing mission to provide you with exceptional heart care, we have created designated Provider Care Teams.  These Care Teams include your primary Cardiologist (physician) and Advanced Practice Providers (APPs -  Physician Assistants and Nurse Practitioners) who all work together to provide you with the care you need, when you need it.  We recommend signing up for the patient portal called "MyChart".  Sign up information is provided on this After Visit Summary.  MyChart is used to connect with patients for Virtual Visits (Telemedicine).  Patients are able to view lab/test results, encounter notes, upcoming appointments, etc.  Non-urgent messages can be sent to your provider as well.   To learn more about what you can do with MyChart, go to [email or web address].    Your next appointment:   6 month(s)  The format for your next appointment:   In Person  Provider:   Nicholes Rough, PA-C, Melina Copa, PA-C, Ambrose Pancoast, NP, Ermalinda Barrios, PA-C, Christen Bame, NP, or Richardson Dopp, PA-C     {   Important Information About Sugar         I,Mathew Stumpf,acting as a scribe for Candee Furbish, MD.,have documented all relevant documentation on the behalf of Candee Furbish, MD,as directed by  Candee Furbish, MD while in the presence of Candee Furbish, MD.  I, Candee Furbish, MD, have reviewed all documentation for this visit. The documentation on 01/31/22 for the exam, diagnosis, procedures, and orders are all accurate and complete.   Signed, Candee Furbish, MD  01/31/2022 10:58 AM    [facility]

## 2022-01-31 NOTE — Patient Instructions (Signed)
Medication Instructions:  Please start Amlodipine 5 mg once a day. Continue all other medications as listed.  *If you need a refill on your cardiac medications before your next appointment, please call your pharmacy*  You have been referred to our Hypertension Clinic to follow up in 1 to 2 months.  Follow-Up: At [facility], you and your health needs are our priority.  As part of our continuing mission to provide you with exceptional heart care, we have created designated Provider Care Teams.  These Care Teams include your primary Cardiologist (physician) and Advanced Practice Providers (APPs -  Physician Assistants and Nurse Practitioners) who all work together to provide you with the care you need, when you need it.  We recommend signing up for the patient portal called "MyChart".  Sign up information is provided on this After Visit Summary.  MyChart is used to connect with patients for Virtual Visits (Telemedicine).  Patients are able to view lab/test results, encounter notes, upcoming appointments, etc.  Non-urgent messages can be sent to your provider as well.   To learn more about what you can do with MyChart, go to [email or web address].    Your next appointment:   6 month(s)  The format for your next appointment:   In Person  Provider:   Nicholes Rough, PA-C, Melina Copa, PA-C, Ambrose Pancoast, NP, Ermalinda Barrios, PA-C, Christen Bame, NP, or Richardson Dopp, PA-C     {   Important Information About Sugar

## 2022-01-31 NOTE — Assessment & Plan Note (Signed)
CT scan personally reviewed demonstrates no evidence of adrenal mass and no evidence of renal artery stenosis.  We are adding amlodipine 5 mg once a day. Continue with olmesartan 40 mg Continue with atenolol 50 mg once a day.  If necessary in the future we could switch this over to carvedilol.  Continue with HCTZ 12.5 mg once a day.

## 2022-02-21 DIAGNOSIS — R972 Elevated prostate specific antigen [PSA]: Secondary | ICD-10-CM | POA: Diagnosis not present

## 2022-02-28 DIAGNOSIS — R351 Nocturia: Secondary | ICD-10-CM | POA: Diagnosis not present

## 2022-02-28 DIAGNOSIS — R972 Elevated prostate specific antigen [PSA]: Secondary | ICD-10-CM | POA: Diagnosis not present

## 2022-02-28 DIAGNOSIS — N401 Enlarged prostate with lower urinary tract symptoms: Secondary | ICD-10-CM | POA: Diagnosis not present

## 2022-04-02 ENCOUNTER — Ambulatory Visit: Payer: PPO | Admitting: Pharmacist

## 2022-04-02 VITALS — BP 160/86 | HR 64

## 2022-04-02 DIAGNOSIS — E785 Hyperlipidemia, unspecified: Secondary | ICD-10-CM | POA: Insufficient documentation

## 2022-04-02 DIAGNOSIS — E782 Mixed hyperlipidemia: Secondary | ICD-10-CM

## 2022-04-02 DIAGNOSIS — I2510 Atherosclerotic heart disease of native coronary artery without angina pectoris: Secondary | ICD-10-CM | POA: Diagnosis not present

## 2022-04-02 DIAGNOSIS — I10 Essential (primary) hypertension: Secondary | ICD-10-CM

## 2022-04-02 DIAGNOSIS — I2584 Coronary atherosclerosis due to calcified coronary lesion: Secondary | ICD-10-CM | POA: Diagnosis not present

## 2022-04-02 MED ORDER — HYDROCHLOROTHIAZIDE 25 MG PO TABS: 25.0000 mg | ORAL_TABLET | Freq: Every day | ORAL | 3 refills | Status: DC

## 2022-04-02 MED ORDER — AMLODIPINE BESYLATE 10 MG PO TABS: 10.0000 mg | ORAL_TABLET | Freq: Every day | ORAL | 3 refills | Status: DC

## 2022-04-02 MED ORDER — ROSUVASTATIN CALCIUM 10 MG PO TABS: 10.0000 mg | ORAL_TABLET | Freq: Every day | ORAL | 3 refills | Status: DC

## 2022-04-02 NOTE — Patient Instructions (Addendum)
Your blood pressure goal is < 130/27mHg  Increase amlodipine from '5mg'$  to '10mg'$  once daily  Increase HCTZ (hydrochlorothiazide) from 12.'5mg'$  to '25mg'$  once daily  Decrease your atenolol from '50mg'$  to '25mg'$  for 2 weeks, then stop  Continue taking your other medications  Limit daily sodium to < 2,'000mg'$  and aim for 150 minutes of activity each week  Monitor and record your blood pressure at home. Bring in a log of your readings and cuff to your next visit on August 10th

## 2022-04-02 NOTE — Progress Notes (Signed)
Patient ID: Christopher Castillo                 DOB: [birth year]                      MRN: [id number]     HPI: Christopher Castillo is a 71 y.o. male referred by Dr. Marlou Porch to HTN clinic. PMH is significant for HTN and GERD. He was admitted 03/23/28 with systolic BP > 562 and HCTZ was added. CTA 11/15/21 showed scattered coronary artery calcifications, no evidence of adrenal mass and no evidence of renal artery stenosis. Saw Dr Marlou Porch on 01/31/22 where BP was elevated at 160/80. He was started on amlodipine '5mg'$  daily and also advised to hold his rosuvastatin '20mg'$  for 2 weeks to see if sluggish feeling resolved, then resume at lower dose of '10mg'$  daily.  Pt presents today for follow up. Reports fatigue and feeling sluggish improved drastically within a few weeks of stopping his rosuvastatin. Has not restarted lower dose yet. Does have Fhx of CAD, reports his father had open heart surgery at age 807.  Has been tolerating amlodiipne well. Denies dizziness, headache, and LE edema. Used to have migraines when his BP was very elevated but hasn't had one in a long time. Takes all meds in the AM except his tamsulosin and finasteride which he takes at night. Home BP 140/80s, checks in the evening. Denies NSAID use. Watches sodium intake and stays active.  Current HTN meds: amlodipine '5mg'$  daily, olmesartan '40mg'$  daily, HCTZ 12.'5mg'$  daily, atenolol '50mg'$  daily - takes all in AM  BP goal: <130/69mHg  Family History: Father had bypass surgery at age 69  Social History: Denies tobacco, alcohol and drug use.  Diet: 1 cup of coffee in AM. 1-2 diet sodas during the day, trying to change to crystal light instead. Doesn't like fried food. Likes chicken, vegetables, and fruit. Trying to avoid processed food and deli meat. Doesn't add salt to food.  Exercise: Works in the yard, [id number] readings:  Labs:  11/16/21: SCr 1, K 3.7, Na 137  11/27/20: TC 155, HDL 37, LDL 95, nonHDL 118, TG 116   Wt Readings from Last 3  Encounters:  01/31/22 170 lb (77.1 kg)  11/15/21 161 lb 6 oz (73.2 kg)  12/12/16 160 lb 8 oz (72.8 kg)   BP Readings from Last 3 Encounters:  01/31/22 (!) 160/80  11/16/21 (!) 168/98  12/12/16 123/88   Pulse Readings from Last 3 Encounters:  01/31/22 69  11/16/21 73  12/12/16 60    Renal function: CrCl cannot be calculated (Patient's most recent lab result is older than the maximum 21 days allowed.).  Past Medical History:  Diagnosis Date   GERD (gastroesophageal reflux disease)    History of kidney stones    History of urinary tract infection    Hypertension     Current Outpatient Medications on File Prior to Visit  Medication Sig Dispense Refill   amLODipine (NORVASC) 5 MG tablet Take 1 tablet (5 mg total) by mouth daily. 90 tablet 3   aspirin EC 81 MG tablet Take 81 mg by mouth daily.     atenolol (TENORMIN) 50 MG tablet Take 50 mg by mouth daily.     fenofibrate (TRICOR) 145 MG tablet Take 145 mg by mouth daily.     finasteride (PROSCAR) 5 MG tablet TAKE 1 TABLET BY MOUTH EVERY DAY 30 tablet 11   hydrochlorothiazide (HYDRODIURIL) 12.5 MG tablet Take  1 tablet (12.5 mg total) by mouth daily. 90 tablet 3   olmesartan (BENICAR) 40 MG tablet Take 40 mg by mouth daily.     omeprazole (PRILOSEC) 20 MG capsule Take 20 mg by mouth daily.     rosuvastatin (CRESTOR) 20 MG tablet Take 1 tablet (20 mg total) by mouth daily. 30 tablet 2   tamsulosin (FLOMAX) 0.4 MG CAPS capsule Take 0.4 mg by mouth daily.     No current facility-administered medications on file prior to visit.    No Known Allergies   Assessment/Plan:  1. Hypertension - BP remains elevated above goal < 130/55mHg, pt does report some white coat HTN but home readings have been elevated as well. Will increase amlodipine to '10mg'$  daily and increase HCTZ to '25mg'$  daily. Will not change to chlorthalidone despite greater efficacy due to greater risk of electrolyte abnormalities and Na and K are already on lower end of  normal range. Will decrease atenolol from '50mg'$  to '25mg'$  daily for 2 weeks, then stop, as pt has no indication for beta blocker therapy and this is 5-6th line for BP control. Will continue telmisartan '40mg'$  daily. Encouraged pt to aim for < 2,'000mg'$  of daily sodium and 150 minutes of activity per week. He'll record BP at home and bring in log and readings to next visit. Can consider spironolactone if needed. Recheck BP and BMET in 3-4 weeks.  2. Hyperlipidemia - LDL 95 last year above goal < 70 due to aortic atherosclerosis noted on CT scan and FHx of premature CAD. Pt did not tolerate rosuvastatin '20mg'$  daily due to fatigue. Will rechallenge at lower dose of '10mg'$  daily. Advised pt if sx return on this dose, to cut tablet in half and take '5mg'$  daily. Has also been taking fenofibrate per PCP. Can re-evaluate need for this once future labs are rechecked.  [name], PharmD, BCACP, [id number] [place] [address]Phone: [phone] Fax: [phone]/18/2023 10:05 AM

## 2022-04-18 DIAGNOSIS — H35361 Drusen (degenerative) of macula, right eye: Secondary | ICD-10-CM | POA: Diagnosis not present

## 2022-04-18 DIAGNOSIS — H35033 Hypertensive retinopathy, bilateral: Secondary | ICD-10-CM | POA: Diagnosis not present

## 2022-04-18 DIAGNOSIS — D3132 Benign neoplasm of left choroid: Secondary | ICD-10-CM | POA: Diagnosis not present

## 2022-04-18 DIAGNOSIS — H34832 Tributary (branch) retinal vein occlusion, left eye, with macular edema: Secondary | ICD-10-CM | POA: Diagnosis not present

## 2022-04-24 NOTE — Progress Notes (Unsigned)
Patient ID: Christopher Castillo                 DOB: Sep 25, 1950                      MRN: [id number]     HPI: Christopher Castillo is a 71 y.o. male referred by Dr. Marlou Porch to HTN clinic. PMH is significant for HTN, GERD, and FHx of premature CAD. He was admitted 0/6/26 with systolic BP > 948 and HCTZ was added. CTA 11/15/21 showed scattered coronary artery calcifications, no evidence of adrenal mass and no evidence of renal artery stenosis. Saw Dr Marlou Porch on 01/31/22 where BP was elevated at 160/80. He was started on amlodipine '5mg'$  daily and also advised to hold his rosuvastatin '20mg'$  for 2 weeks to see if sluggish feeling resolved, then resume at lower dose of '10mg'$  daily.  At previous visit with PharmD on 04/02/22, pt reported fatigue and feeling sluggish improved drastically within a few weeks of stopping his rosuvastatin. Denies NSAID use. Watches sodium intake and stays active. Planned to rechallenge rosuvastatin at lower dose of '10mg'$  daily at that time and advised pt if sx return on this dose, to cut tablet in half and take '5mg'$  daily. BP remained elevated at 160/86 (pt reported white coat HTN as well) and amlodipine was increased to 10 mg daily, hydrochlorothiazide was increased to 25 mg daily, atenolol was decreased from 50 mg to 25 mg daily for 2 weeks then stopped due to lack of indication for beta blocker therapy, and olmesartan 40 mg daily was continued.   Pt presents today for HTN follow up with PharmD. Patient reports fatigue and joint pain on rosuvastatin '10mg'$  which he took for 2 weeks. Had difficulty completing normal daily activities (such as yard work, heavy lifting) without feeling fatigued. He then reduced the dose to 5 mg daily but is still feeling fatigued with yard working, walking and exercise. He is also experiencing joint tenderness in his elbows and knees on top of muscle fatigue. He compares this feeling to being similar to when he had mono and was on high dose prednisone though not quite the same.  Patient shares he really doesn't like to be still and wants to be active but feeling fatigued on rosuvastatin is limiting his ability to do his normal activities. He is willing to try a different statin.   He has tolerated the titrations in his blood pressure medications and is taking amlodipine 10 mg daily, hydrochlorothiazide 25 mg daily and olmesartan 40 mg daily without any issues except for a few rare episodes of feeling a bit dizzy. He also tapered off the atenolol as recommended and has been off of it for about 2 weeks and has noticed his HR increasing to the 80s. He noted that sometimes in the morning his feet are slightly swollen but feels better later in the day. He has not experienced headaches. Previously when his BP was high he used to get migraines but this has improved since managing his BP better. Home readings have mostly been at goal with a few elevated readings with systolic BPs in 546E but after resting for 10-15 minutes repeat readings were much lower. Patient brought in his Omron BP cuff. He shares that he typically takes his BP at night around 8-10 pm. He did not have any caffeine before coming to clinic today.   Home Omron bicep BP cuff: 135/23mHg Clinic manual BP reading: 132/868mg  Current HTN meds: amlodipine '10mg'$   daily, olmesartan '40mg'$  daily, HCTZ 25 mg daily - takes all in AM  BP goal: <130/42mHg  Family History: Father had bypass surgery at age 69  Social History: Denies tobacco, alcohol and drug use.  Diet: 1 cup of coffee in AM. 1-2 diet sodas during the day, trying to change to crystal light instead. Doesn't like fried food. Likes chicken, vegetables, and fruit. Trying to avoid processed food and deli meat. Doesn't add salt to food.  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   Home BP readings: (from 7/21-8/9) 116/67, 120/72, 162/78, 124/70, 123/76, 120/71, 134/77, 112/67, 124/74,  141/83, 127/76, 145/77, 127/81, 118/74, 118/72,  124/72,148/82, 135/78, 132/81, 131/76, 142/74, 126/76, 125/80, 132/71  HRs in high 80s   Labs:  11/16/21: SCr 1, K 3.7, Na 137  11/27/20: TC 155, HDL 37, LDL 95, nonHDL 118, TG 116   Wt Readings from Last 3 Encounters:  01/31/22 170 lb (77.1 kg)  11/15/21 161 lb 6 oz (73.2 kg)  12/12/16 160 lb 8 oz (72.8 kg)   BP Readings from Last 3 Encounters:  04/02/22 (!) 160/86  01/31/22 (!) 160/80  11/16/21 (!) 168/98   Pulse Readings from Last 3 Encounters:  04/02/22 64  01/31/22 69  11/16/21 73    Renal function: CrCl cannot be calculated (Patient's most recent lab result is older than the maximum 21 days allowed.).  Past Medical History:  Diagnosis Date   GERD (gastroesophageal reflux disease)    History of kidney stones    History of urinary tract infection    Hypertension     Current Outpatient Medications on File Prior to Visit  Medication Sig Dispense Refill   amLODipine (NORVASC) 10 MG tablet Take 1 tablet (10 mg total) by mouth daily. 90 tablet 3   aspirin EC 81 MG tablet Take 81 mg by mouth daily.     atenolol (TENORMIN) 50 MG tablet Take 25 mg by mouth daily.     fenofibrate (TRICOR) 145 MG tablet Take 145 mg by mouth daily.     finasteride (PROSCAR) 5 MG tablet TAKE 1 TABLET BY MOUTH EVERY DAY 30 tablet 11   hydrochlorothiazide (HYDRODIURIL) 25 MG tablet Take 1 tablet (25 mg total) by mouth daily. 90 tablet 3   olmesartan (BENICAR) 40 MG tablet Take 40 mg by mouth daily.     omeprazole (PRILOSEC) 20 MG capsule Take 20 mg by mouth daily.     rosuvastatin (CRESTOR) 10 MG tablet Take 1 tablet (10 mg total) by mouth daily. 90 tablet 3   tamsulosin (FLOMAX) 0.4 MG CAPS capsule Take 0.4 mg by mouth daily.     No current facility-administered medications on file prior to visit.    No Known Allergies   Assessment/Plan:  1. Hypertension - BP very close to goal of <130/80 mmHg at 132/80 today. Home readings are measuring correctly and have mostly been at goal as well. Will  continue amlodipine 10 mg daily, hydrochlorothiazide 25 mg daily and olmesartan 40 mg daily. Will check BMET today to evaluate if any changes in electrolytes and renal function since hydrochlorothiazide was increased at previous visit. We will communicate via telephone if any changes need to be made.   2. Hyperlipidemia - LDL 95 last year above goal < 70 due to aortic atherosclerosis noted on CT scan and FHx of premature CAD. Given patient's muscle fatigue and joint pain despite lowering the dose of his rosuvastatin to '5mg'$  daily, will stop rosuvastatin and monitor  for symptom improvement in the next 1-2 weeks. When symptoms improve, pt will start pravastatin 10 mg daily. Will obtain fasting lipid labs and LFTs in about 2 months. If pt has difficulty tolerating pravastatin, would try ezetimibe next.   Eliseo Gum, PharmD PGY1 Pharmacy Resident   [place], PharmD, BCACP, [facility] [address], [place], [place] [address] Phone: [phone]; Fax: [phone]

## 2022-04-25 ENCOUNTER — Ambulatory Visit: Payer: PPO | Admitting: Pharmacist

## 2022-04-25 VITALS — BP 132/80 | HR 107

## 2022-04-25 DIAGNOSIS — I10 Essential (primary) hypertension: Secondary | ICD-10-CM | POA: Diagnosis not present

## 2022-04-25 DIAGNOSIS — E782 Mixed hyperlipidemia: Secondary | ICD-10-CM | POA: Diagnosis not present

## 2022-04-25 LAB — BASIC METABOLIC PANEL
BUN/Creatinine Ratio: 17 (ref 10–24)
BUN: 20 mg/dL (ref 8–27)
CO2: 23 mmol/L (ref 20–29)
Calcium: 9.7 mg/dL (ref 8.6–10.2)
Chloride: 103 mmol/L (ref 96–106)
Creatinine, Ser: 1.15 mg/dL (ref 0.76–1.27)
Glucose: 143 mg/dL — ABNORMAL HIGH (ref 70–99)
Potassium: 4.5 mmol/L (ref 3.5–5.2)
Sodium: 143 mmol/L (ref 134–144)
eGFR: 69 mL/min/{1.73_m2} (ref >59)

## 2022-04-25 MED ORDER — PRAVASTATIN SODIUM 20 MG PO TABS: 10.0000 mg | ORAL_TABLET | Freq: Every evening | ORAL | 11 refills | Status: DC

## 2022-04-25 NOTE — Patient Instructions (Addendum)
Stop taking rosuvastatin. Monitor for improvement in fatigue and joint pain over the next few weeks. Once you are feeling better, start pravastatin '10mg'$  (1/2 tablet) once a day. Call clinic if you notice similar side effects with this medication at [phone]. Otherwise, we'll plan to recheck fasting labs on Monday, September 25th any time after 7:30am. Your LDL cholesterol goal is < 70  Your blood pressure is excellent and at goal < 130/52mHg. Continue taking your current medications

## 2022-04-29 ENCOUNTER — Encounter: Payer: Self-pay | Admitting: Pharmacist

## 2022-04-29 DIAGNOSIS — I872 Venous insufficiency (chronic) (peripheral): Secondary | ICD-10-CM | POA: Diagnosis not present

## 2022-05-28 ENCOUNTER — Telehealth: Payer: Self-pay | Admitting: Cardiology

## 2022-05-28 NOTE — Telephone Encounter (Signed)
Pt c/o medication issue:  1. Name of Medication:   pravastatin (PRAVACHOL) 20 MG tablet  2. How are you currently taking this medication (dosage and times per day)? As prescribed  3. Are you having a reaction (difficulty breathing--STAT)?  No  4. What is your medication issue?    Patient stated she has been on this medication for three weeks.  Patient called as requested to report on his usage of  his rosuvastatin (CRESTOR) 10 MG tablet and to report that he is having the same problems with this medication as the rosuvastatin.  Patient stated he stays weak all the time and has aches in his joints.

## 2022-05-28 NOTE — Telephone Encounter (Signed)
Pt is calling to give [facility] Pharmacist in [facility] an update on how he's doing on pravastatin.   Pt is heavily followed by our lipid clinic here in the office.   Pt states Jinny Blossom saw him back in Aug and switched him from crestor to now pravastatin.  Pt states he is taking pravastatin and it is causing him all the same side effects that crestor caused.   Pt states he's having joint aches and feeling extremely tired all day.  He states he is an active guy, but while taking statins this has affected his activity level tremendously.  Pt stated that Jinny Blossom wanted him to follow-up with the office on how his tolerability to pravastatin change is going.  He confirmed it's unchanged from when he started statins all together.   Pt aware that Jinny Blossom is out on maternity leave, but I will get this message to our other Pharmacist Marcelle Overlie for further advisement and follow-up with him.  Pt verbalized understanding and agrees with this plan.

## 2022-05-29 DIAGNOSIS — H40013 Open angle with borderline findings, low risk, bilateral: Secondary | ICD-10-CM | POA: Diagnosis not present

## 2022-05-29 MED ORDER — EZETIMIBE 10 MG PO TABS: 10.0000 mg | ORAL_TABLET | Freq: Every day | ORAL | 3 refills | Status: DC

## 2022-05-29 NOTE — Telephone Encounter (Signed)
Called patient. He reports joint aches, muscle aches and fatigue with pravastatin. Has taken it for about 3 weeks. Says its identical to what happened with rosuvastatin. Will stop pravastatin. Start ezetimibe '10mg'$  daily. Labs rescheduled for 11/8. Patient will reach out if he has any issues.

## 2022-06-10 ENCOUNTER — Other Ambulatory Visit: Payer: PPO

## 2022-06-17 ENCOUNTER — Other Ambulatory Visit: Payer: PPO

## 2022-07-24 ENCOUNTER — Ambulatory Visit: Payer: PPO | Attending: Cardiology

## 2022-07-24 DIAGNOSIS — E782 Mixed hyperlipidemia: Secondary | ICD-10-CM

## 2022-07-24 LAB — HEPATIC FUNCTION PANEL
ALT: 45 IU/L — ABNORMAL HIGH (ref 0–44)
AST: 37 IU/L (ref 0–40)
Albumin: 4.7 g/dL (ref 3.9–4.9)
Alkaline Phosphatase: 57 IU/L (ref 44–121)
Bilirubin Total: 0.4 mg/dL (ref 0.0–1.2)
Bilirubin, Direct: 0.17 mg/dL (ref 0.00–0.40)
Total Protein: 7.1 g/dL (ref 6.0–8.5)

## 2022-07-24 LAB — LIPID PANEL
Chol/HDL Ratio: 4.3 ratio (ref 0.0–5.0)
Cholesterol, Total: 132 mg/dL (ref 100–199)
HDL: 31 mg/dL — ABNORMAL LOW (ref >39)
LDL Chol Calc (NIH): 81 mg/dL (ref 0–99)
Triglycerides: 104 mg/dL (ref 0–149)
VLDL Cholesterol Cal: 20 mg/dL (ref 5–40)

## 2022-07-28 NOTE — Progress Notes (Unsigned)
Office Visit    Patient Name: Christopher Castillo Date of Encounter: 07/28/2022  Primary Care Provider:  Haywood Pao, MD Primary Cardiologist:  Candee Furbish, MD Primary Electrophysiologist: None  Chief Complaint    Christopher Castillo is a 71 y.o. 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  Past Medical History    Past Medical History:  Diagnosis Date   GERD (gastroesophageal reflux disease)    History of kidney stones    History of urinary tract infection    Hypertension    Past Surgical History:  Procedure Laterality Date   colonscopy     EXTRACORPOREAL SHOCK WAVE LITHOTRIPSY Right 12/12/2016   Procedure: RIGHT EXTRACORPOREAL SHOCK WAVE LITHOTRIPSY (ESWL);  Surgeon: PCleon Gustin MD;  Location: [facility];  Service: Urology;  Laterality: Right;    Allergies  Allergies  Allergen Reactions   Rosuvastatin     Fatigue and joint pain on 5-'20mg'$  daily    History of Present Illness    Christopher Castillo is a 71year old male with the above mention past medical history who presents today for 660-monthollow-up of hypertension and palpitations.  Christopher Castillo recently seen by Dr. SkMarlou Porchor consultation 11/2021 at request of his PCP for evaluation of PACs and tachycardia.  He was seen in the ED with complaint of epigastric pain after eating greasy foods.  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  His EKG in the ED revealed PACs with atrial bigeminy in the 100s to 113.  He has a family history of premature heart disease but denied any chest pain or dyspnea.  His blood pressures were elevated in the 20062Iystolically and he was started on nitro drip.  He was previously on atenolol at home and this was restarted for atrial tach and  he was switched to olmesartan 40 mg for blood pressure control.  His PVCs improved after resuming atenolol and event monitor was not ordered at that time.    Mr. PrEislerresents today for 6-2-monthllow-up alone.  Since last being seen in the office patient reports that he has been doing well from a cardiac perspective with no complaints of palpitations or chest discomfort.  His blood pressure today is improved at 130/80 and heart rate is 100 bpm.  He stays active and does lots of strenuous yard work and plays golf as well.  He stays hydrated daily and is tolerating his additional medications without any adverse reactions.  I did notice some lower extremity edema today otherwise he was euvolemic on exam.  During our visit we discussed the pathophysiology of aortic dilations and the importance of maintaining good blood pressure and heart rate control and preventing progression of disease. Patient denies chest pain, palpitations, dyspnea, PND, orthopnea, nausea, vomiting, dizziness, syncope, edema, weight gain, or early satiety.   Home Medications    Current Outpatient Medications  Medication Sig Dispense Refill   amLODipine (NORVASC) 10 MG tablet Take 1 tablet (10 mg total) by mouth daily. 90 tablet 3   aspirin EC 81 MG tablet Take 81 mg by mouth daily.     ezetimibe (ZETIA) 10 MG tablet Take 1 tablet (10 mg total) by mouth daily. 90 tablet 3   fenofibrate (TRICOR) 145 MG tablet  Take 145 mg by mouth daily.     finasteride (PROSCAR) 5 MG tablet TAKE 1 TABLET BY MOUTH EVERY DAY 30 tablet 11   hydrochlorothiazide (HYDRODIURIL) 25 MG tablet Take 1 tablet (25 mg total) by mouth daily. 90 tablet 3   olmesartan (BENICAR) 40 MG tablet Take 40 mg by mouth daily.     omeprazole (PRILOSEC) 20 MG capsule Take 20 mg by mouth daily.     tamsulosin (FLOMAX) 0.4 MG CAPS capsule Take 0.4 mg by mouth daily.     No current facility-administered medications for this visit.     Review of Systems  Please see the  history of present illness.    (+) Trace lower extremity edema (+) Fatigue  All other systems reviewed and are otherwise negative except as noted above.  Physical Exam    Wt Readings from Last 3 Encounters:  01/31/22 170 lb (77.1 kg)  11/15/21 161 lb 6 oz (73.2 kg)  12/12/16 160 lb 8 oz (72.8 kg)   WE:RXVQM were no vitals filed for this visit.,There is no height or weight on file to calculate BMI.  Constitutional:      Appearance: Healthy appearance. Not in distress.  Neck:     Vascular: JVD normal.  Pulmonary:     Effort: Pulmonary effort is normal.     Breath sounds: No wheezing. No rales. Diminished in the bases Cardiovascular:     Tachycardic. Regular rhythm. Normal S1. Normal S2.      Murmurs: There is no murmur.  Edema:    Trace lower extremity edema Abdominal:     Palpations: Abdomen is soft non tender. There is no hepatomegaly.  Skin:    General: Skin is warm and dry.  Neurological:     General: No focal deficit present.     Mental Status: Alert and oriented to person, place and time.     Cranial Nerves: Cranial nerves are intact.  EKG/LABS/Other Studies Reviewed    ECG personally reviewed by me today -none completed today    Lab Results  Component Value Date   WBC 10.4 11/14/2021   HGB 16.8 11/14/2021   HCT 50.0 11/14/2021   MCV 86.4 11/14/2021   PLT 199 11/14/2021   Lab Results  Component Value Date   CREATININE 1.15 04/25/2022   BUN 20 04/25/2022   NA 143 04/25/2022   K 4.5 04/25/2022   CL 103 04/25/2022   CO2 23 04/25/2022   Lab Results  Component Value Date   ALT 45 (H) 07/24/2022   AST 37 07/24/2022   ALKPHOS 57 07/24/2022   BILITOT 0.4 07/24/2022   Lab Results  Component Value Date   CHOL 132 07/24/2022   HDL 31 (L) 07/24/2022   LDLCALC 81 07/24/2022   TRIG 104 07/24/2022   CHOLHDL 4.3 07/24/2022    No results found for: "HGBA1C"  Assessment & Plan    1.  History of coronary calcifications: -Aortic calcification seen on CT  scan -Continue primary prevention with ASA, Zetia with fenofibrate -Blood pressure today much improved from previous visit.  2.  Essential hypertension: -Blood pressure today was 130/80 at goal. -Patient had atenolol discontinued by PCP at last visit with elevated heart rate today in the 100s. -We will add carvedilol 3.125 and decrease amlodipine to 5 mg due to lower extremity edema. -Continue Benicar 40 mg and HCTZ 25 mg -BMET today -Patient will check blood pressures and report back in 2 weeks  3.  History of PACs: -Atenolol was  discontinued at previous PCP visit.  Patient still endorses tachycardia with reduction in palpitations. -We will add carvedilol 3.125 for rate and BP control  4.  Infrarenal aortic dilation: -Mild focal dilatation of the infrarenal abdominal aorta measuring up to 2.7 cm with mural thrombus  -Continue Zetia 10 mg, fenofibrate 145 mg, ASA 81 mg  5.  Hyperlipidemia: -Most recent LDL was 81 slightly above goal of less than 70 -Patient denies any increased fatigue or myalgias currently. -Continue Zetia 10 mg, fenofibrate 145 mg  Disposition: Follow-up with Candee Furbish, MD or APP in  3 months   Medication Adjustments/Labs and Tests Ordered: Current medicines are reviewed at length with the patient today.  Concerns regarding medicines are outlined above.   Signed, Christopher Castillo, Marissa Nestle, NP 07/28/2022, 7:43 PM [facility]  Note:  This document was prepared using Dragon voice recognition software and may include unintentional dictation errors.

## 2022-07-30 ENCOUNTER — Ambulatory Visit: Payer: PPO | Attending: Nurse Practitioner | Admitting: Nurse Practitioner

## 2022-07-30 ENCOUNTER — Encounter: Payer: Self-pay | Admitting: Nurse Practitioner

## 2022-07-30 VITALS — BP 130/80 | HR 100 | Ht 66.0 in | Wt 171.6 lb

## 2022-07-30 DIAGNOSIS — I2510 Atherosclerotic heart disease of native coronary artery without angina pectoris: Secondary | ICD-10-CM

## 2022-07-30 DIAGNOSIS — I2584 Coronary atherosclerosis due to calcified coronary lesion: Secondary | ICD-10-CM | POA: Diagnosis not present

## 2022-07-30 DIAGNOSIS — I10 Essential (primary) hypertension: Secondary | ICD-10-CM | POA: Diagnosis not present

## 2022-07-30 DIAGNOSIS — I77819 Aortic ectasia, unspecified site: Secondary | ICD-10-CM

## 2022-07-30 DIAGNOSIS — E782 Mixed hyperlipidemia: Secondary | ICD-10-CM

## 2022-07-30 DIAGNOSIS — R002 Palpitations: Secondary | ICD-10-CM | POA: Diagnosis not present

## 2022-07-30 LAB — BASIC METABOLIC PANEL
BUN/Creatinine Ratio: 17 (ref 10–24)
BUN: 20 mg/dL (ref 8–27)
CO2: 24 mmol/L (ref 20–29)
Calcium: 10.1 mg/dL (ref 8.6–10.2)
Chloride: 102 mmol/L (ref 96–106)
Creatinine, Ser: 1.16 mg/dL (ref 0.76–1.27)
Glucose: 114 mg/dL — ABNORMAL HIGH (ref 70–99)
Potassium: 4.1 mmol/L (ref 3.5–5.2)
Sodium: 140 mmol/L (ref 134–144)
eGFR: 68 mL/min/{1.73_m2} (ref >59)

## 2022-07-30 MED ORDER — AMLODIPINE BESYLATE 5 MG PO TABS: 5.0000 mg | ORAL_TABLET | Freq: Every day | ORAL | 3 refills | Status: DC

## 2022-07-30 MED ORDER — CARVEDILOL 3.125 MG PO TABS: 3.1250 mg | ORAL_TABLET | Freq: Two times a day (BID) | ORAL | 3 refills | Status: DC

## 2022-07-30 NOTE — Patient Instructions (Signed)
Medication Instructions:  START Coreg (Carvedilol) 3.'125mg'$  take 1 tablet twice a day  DECREASE Norvasc (Amlodipine) to '5mg'$  take 1 tablet once a day  *If you need a refill on your cardiac medications before your next appointment, please call your pharmacy*   Lab Work: TODAY-BMET If you have labs (blood work) drawn today and your tests are completely normal, you will receive your results only by: [place] (if you have MyChart) OR A paper copy in the mail If you have any lab test that is abnormal or we need to change your treatment, we will call you to review the results.   Testing/Procedures: NONE ORDERED   Follow-Up: At [facility], you and your health needs are our priority.  As part of our continuing mission to provide you with exceptional heart care, we have created designated Provider Care Teams.  These Care Teams include your primary Cardiologist (physician) and Advanced Practice Providers (APPs -  Physician Assistants and Nurse Practitioners) who all work together to provide you with the care you need, when you need it.  We recommend signing up for the patient portal called "MyChart".  Sign up information is provided on this After Visit Summary.  MyChart is used to connect with patients for Virtual Visits (Telemedicine).  Patients are able to view lab/test results, encounter notes, upcoming appointments, etc.  Non-urgent messages can be sent to your provider as well.   To learn more about what you can do with MyChart, go to [email or web address].    Your next appointment:   3 month(s)  The format for your next appointment:   In Person  Provider:   Ambrose Pancoast, NP        Other Instructions [place] (FIRST 2-3 HOURS AFTER TAKING BLOOD PRESSURE MEDICATION AND IN THE EVENING) RECORD YOUR READINGS THEN CONTACT THE OFFICE OR SEND THROUGH [facility]  Important Information About Sugar

## 2022-08-01 DIAGNOSIS — H34832 Tributary (branch) retinal vein occlusion, left eye, with macular edema: Secondary | ICD-10-CM | POA: Diagnosis not present

## 2022-08-01 DIAGNOSIS — H35033 Hypertensive retinopathy, bilateral: Secondary | ICD-10-CM | POA: Diagnosis not present

## 2022-08-01 DIAGNOSIS — D3132 Benign neoplasm of left choroid: Secondary | ICD-10-CM | POA: Diagnosis not present

## 2022-08-01 DIAGNOSIS — H35361 Drusen (degenerative) of macula, right eye: Secondary | ICD-10-CM | POA: Diagnosis not present

## 2022-08-22 MED ORDER — METOPROLOL TARTRATE 25 MG PO TABS: 25.0000 mg | ORAL_TABLET | Freq: Two times a day (BID) | ORAL | 3 refills | Status: DC

## 2022-09-04 DIAGNOSIS — D225 Melanocytic nevi of trunk: Secondary | ICD-10-CM | POA: Diagnosis not present

## 2022-09-04 DIAGNOSIS — L821 Other seborrheic keratosis: Secondary | ICD-10-CM | POA: Diagnosis not present

## 2022-09-04 DIAGNOSIS — L570 Actinic keratosis: Secondary | ICD-10-CM | POA: Diagnosis not present

## 2022-09-04 DIAGNOSIS — D044 Carcinoma in situ of skin of scalp and neck: Secondary | ICD-10-CM | POA: Diagnosis not present

## 2022-09-04 DIAGNOSIS — L814 Other melanin hyperpigmentation: Secondary | ICD-10-CM | POA: Diagnosis not present

## 2022-09-18 ENCOUNTER — Telehealth: Payer: Self-pay | Admitting: Pharmacist

## 2022-09-18 ENCOUNTER — Encounter: Payer: Self-pay | Admitting: Pharmacist

## 2022-09-18 ENCOUNTER — Ambulatory Visit: Payer: PPO | Attending: Internal Medicine | Admitting: Pharmacist

## 2022-09-18 VITALS — BP 142/82 | HR 72

## 2022-09-18 DIAGNOSIS — I2510 Atherosclerotic heart disease of native coronary artery without angina pectoris: Secondary | ICD-10-CM | POA: Diagnosis not present

## 2022-09-18 DIAGNOSIS — I2584 Coronary atherosclerosis due to calcified coronary lesion: Secondary | ICD-10-CM | POA: Diagnosis not present

## 2022-09-18 DIAGNOSIS — E782 Mixed hyperlipidemia: Secondary | ICD-10-CM | POA: Diagnosis not present

## 2022-09-18 DIAGNOSIS — I10 Essential (primary) hypertension: Secondary | ICD-10-CM | POA: Diagnosis not present

## 2022-09-18 MED ORDER — CHLORTHALIDONE 25 MG PO TABS: 25.0000 mg | ORAL_TABLET | Freq: Every day | ORAL | 3 refills | Status: DC

## 2022-09-18 MED ORDER — METOPROLOL SUCCINATE ER 50 MG PO TB24: 75.0000 mg | ORAL_TABLET | Freq: Every day | ORAL | 3 refills | Status: DC

## 2022-09-18 MED ORDER — NEXLIZET 180-10 MG PO TABS: 1.0000 | ORAL_TABLET | Freq: Every day | ORAL | 3 refills | Status: DC

## 2022-09-18 NOTE — Telephone Encounter (Signed)
[employer] grant for [employer] approved -  CARD NO. [id number]   BIN [id number]   PCN [id number]   GROUP [id number]  Waiting on Nexlizet PA to be approved. Then will call in grant info to his mail order pharmacy and also recap med changes made at today's visit.

## 2022-09-18 NOTE — Patient Instructions (Addendum)
Your blood pressure goal is < 130/9mHg Stop taking hydrochlorothiazide (HCTZ) and start taking chlorthalidone - 1 tablet daily  Stop taking metoprolol tartrate and start taking metoprolol succinate - 1.5 tablets once daily  Continue taking your other blood pressure medications (amlodipine and olmesartan)  Continue to monitor your blood pressure at home and limit salt to <2g daily   Your LDL cholesterol is 81 and your goal is < 70 Stop taking ezetimibe and start taking Nexlizet 1 tablet daily  Stop taking fenofibrate  Recheck fasting labs when you see EJaquelyn Bitternext month

## 2022-09-18 NOTE — Telephone Encounter (Signed)
Prior auth approved through 01/17/23. [facility] grant info called into pt's mail order pharmacy, I also relayed the med changes made at today's visit.

## 2022-09-18 NOTE — Progress Notes (Signed)
Patient ID: Christopher Castillo                 DOB: Jul 13, 1951                    MRN: [id number]     HPI: Christopher Castillo is a 72 y.o. male patient referred to lipid clinic by Dr Marlou Porch. 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 CTA 11/15/21 showed scattered coronary artery calcifications, no evidence of adrenal mass and no evidence of renal artery stenosis. I have previously followed pt for HTN and HLD. BP well controlled in August on HCTZ '25mg'$  daily, olmesartan '40mg'$  daily, and amlodipine '10mg'$  daily. At last appt with Ambrose Pancoast, NP on 07/30/22, amlodipine was decreased to '5mg'$  daily due to LE edema and carvedilol 3.'125mg'$  BID was started (HR in 100s). He sent in a MyChart message 3 weeks later reporting numbness on the bottom of his feet and 3-5 lb weight gain. He was advised to stop carvedilol and start metoprolol tartrate '25mg'$  BID.  Pt presents today for follow up. LE edema in ankles looks much better, none noted on exam today. Tolerating his medications well. Home BP 137-138/70s, cuff previously brought to clinic and measuring accurately. Is up about 5 lbs at home, not sure why as he hasn't made notable changes in his diet or exercise. Weight usually 162-163, now 168 at home. Used to taking all meds in AM, sometimes hard to remember PM dose of metoprolol. Still having some numbness in the back of his ankles into his feet that's been going on for a few months, feels a bit worse when elevating his legs. Sees his PCP in April and will mention.  Current Lipid Meds: ezetimibe '10mg'$  daily, fenofibrate '145mg'$  daily Intolerances: rosuvastatin 5-'20mg'$  daily, pravastatin '10mg'$  daily - fatigue and joint pain Risk Factors: FHx premature CAD, aortic atherosclerosis on abdominal CT LDL goal: '70mg'$ /dL  Current BP Meds: amlodipine '5mg'$   daily, HCTZ '25mg'$  daily, olmesartan '40mg'$  daily, metoprolol tartrate '25mg'$  BID (8am, 6pm) Previously tried: amlodipine '10mg'$  daily - LE edema, carvedilol 3.'125mg'$  BID - numbness on bottom of feet, weight gain BP goal: < 130/30mHg   Diet: 1 cup of coffee in AM. 1-2 diet sodas during the day, trying to change to crystal light instead. Doesn't like fried food. Likes chicken, vegetables, and fruit. Trying to avoid processed food and deli meat. Doesn't add salt to food.   Exercise: He stays active and does lots of strenuous yard work and plays golf as well.   Family History: Father had bypass surgery at age 871  Social History: Denies tobacco, alcohol and drug use.   Labs: 07/24/22: TC 132, TG 104, HDL 31, LDL 81 (ezetimibe '10mg'$  daily, fenofibrate '145mg'$  daily)  Past Medical History:  Diagnosis Date   GERD (gastroesophageal reflux disease)    History of kidney stones    History of urinary tract infection    Hypertension     Current Outpatient Medications on File Prior to Visit  Medication Sig Dispense Refill   amLODipine (NORVASC) 5 MG tablet Take 1 tablet (5 mg total) by mouth daily. 30 tablet 3   aspirin EC 81 MG tablet Take 81 mg by mouth daily.     ezetimibe (ZETIA) 10 MG tablet Take 1 tablet (10 mg total) by mouth daily. 90 tablet 3  fenofibrate (TRICOR) 145 MG tablet Take 145 mg by mouth daily.     finasteride (PROSCAR) 5 MG tablet TAKE 1 TABLET BY MOUTH EVERY DAY 30 tablet 11   hydrochlorothiazide (HYDRODIURIL) 25 MG tablet Take 1 tablet (25 mg total) by mouth daily. 90 tablet 3   metoprolol tartrate (LOPRESSOR) 25 MG tablet Take 1 tablet (25 mg total) by mouth 2 (two) times daily. 180 tablet 3   olmesartan (BENICAR) 40 MG tablet Take 40 mg by mouth daily.     omeprazole (PRILOSEC) 20 MG capsule Take 20 mg by mouth daily.     tamsulosin (FLOMAX) 0.4 MG CAPS capsule Take 0.4 mg by mouth daily.     No current facility-administered medications on file prior to visit.    Allergies   Allergen Reactions   Rosuvastatin     Fatigue and joint pain on 5-'20mg'$  daily    Assessment/Plan:  1. Hyperlipidemia - LDL 81 on ezetimibe '10mg'$  daily, above goal < 70 due to aortic atherosclerosis. Intolerant to low dose rosuvastatin and pravastatin. Discussed changing ezetimibe to Nexlizet or PCSK9i today, pt prefers oral option. Will submit prior authorization for Nexlizet and apply for [employer] (Tier 4 med on his plan which is $100/month unfortunately without grant). Will also stop his fenofibrate, TG normal at 104 on recent labs and shouldn't be needed to keep TG at goal.  2. Hypertension - BP a bit elevated above goal < 130/49mHg since cutting back his amlodipine dose due to LE edema. Tolerating lower dose better. Will change HCTZ '25mg'$  daily to chlorthalidone '25mg'$  daily for slightly better efficacy. Will change metoprolol tartrate '25mg'$  BID to metoprolol succinate '75mg'$  daily - slight dose increase and also once daily formulation for easier dosing for pt. Will continue amlodipine '5mg'$  daily and olmesartan '40mg'$  daily. I'll call pt in 2-3 weeks to check in on home BP readings.  Repeat fasting lipids and CMET same day as APP appt in 6 weeks.  Destany Severns E. Davinci Glotfelty, PharmD, BCACP, [place] [address] [place] [address]Phone: [phone] Fax: [phone]/11/2022 10:04 AM

## 2022-10-04 ENCOUNTER — Telehealth: Payer: Self-pay | Admitting: Pharmacist

## 2022-10-04 NOTE — Telephone Encounter (Signed)
Called pt to follow up with BP. Reports in the high 130s/80 now, HR low to mid 70s. No LE edema. Still having ongoing fatigue (don't think it's med related) but won't increase his beta blocker dose since it could potentially worsen this. Other med doses maxed out (swelling on higher amlodipine dose). Will continue his current meds and encouraged him to limit salt to < 2g daily and stay active. BP will be rechecked in office next month at APP appt.

## 2022-10-10 ENCOUNTER — Other Ambulatory Visit: Payer: Self-pay | Admitting: *Deleted

## 2022-10-10 MED ORDER — AMLODIPINE BESYLATE 5 MG PO TABS: 5.0000 mg | ORAL_TABLET | Freq: Every day | ORAL | 3 refills | Status: DC

## 2022-10-31 NOTE — Progress Notes (Signed)
Office Visit    Patient Name: Christopher Castillo Date of Encounter: 10/31/2022  Primary Care Provider:  Haywood Pao, MD Primary Cardiologist:  Candee Furbish, MD Primary Electrophysiologist: None  Chief Complaint    Christopher Castillo is a 72 y.o. 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 Past Medical History    Past Medical History:  Diagnosis Date   GERD (gastroesophageal reflux disease)    History of kidney stones    History of urinary tract infection    Hypertension    Past Surgical History:  Procedure Laterality Date   colonscopy     EXTRACORPOREAL SHOCK WAVE LITHOTRIPSY Right 12/12/2016   Procedure: RIGHT EXTRACORPOREAL SHOCK WAVE LITHOTRIPSY (ESWL);  Surgeon: PCleon Gustin MD;  Location: [facility];  Service: Urology;  Laterality: Right;    Allergies  Allergies  Allergen Reactions   Rosuvastatin     Fatigue and joint pain on 5-20mg daily    History of Present Illness    Christopher Castillo is a 72year old male with the above mention past medical history who presents today for 659-monthollow-up of hypertension and palpitations.  [name] recently seen by Dr. SkMarlou Porchor consultation 11/2021 at request of his PCP for evaluation of PACs and tachycardia.  He was seen in the ED with complaint of epigastric pain after eating greasy foods.  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  His EKG in the ED revealed PACs with atrial bigeminy in the 100s to 113.  He has a family history of premature heart disease but denied any chest pain or dyspnea.  His blood pressures were elevated in the [id number] and he was started on nitro drip.  He was previously on atenolol at home and this was restarted for atrial tach and he was switched to  olmesartan 40 mg for blood pressure control.  His PVCs improved after resuming atenolol and event monitor was not ordered at that time.    He was seen for 6-62-monthllow-up on 07/30/2022 during at that time he was doing well with no new cardiac complaints.  Blood pressure was improved at 130/80 and heart rate was still elevated at 100 bpm.  He had complaint of lower extremity swelling and amlodipine was decreased to 5 mg and carvedilol was added at 3.125 mg due to elevated heart rate.  He developed some numbness at the bottom of his feet and increased weight gain and carvedilol was discontinued and metoprolol 25 mg twice daily was started.  He was seen in lipid clinic on 09/18/2022 and Zetia was discontinued and switched to nexlizet with fenofibrate being discontinued due to improvement in TGs.  HCTZ was switched to chlorthalidone 25 mg and metoprolol tartrate was switched to Toprol succinate 75 mg.   Christopher Castillo to today alone for 3-m2-monthlow-up of palpitations and blood pressure.  Since last being seen in the office patient reports that he has been doing well with no new cardiac complaints or concerns. He reports that his palpitations are barely noticeable are not symptomatic at this time.  He does note that he tires quickly with activity especially strenuous activity such as moving furniture upstairs or trimming tree limbs.  His blood pressure today is slightly above goal at 138/80  and heart rate is 72 bpm.  He reports that he uses salt very rarely but does eat foods high in sodium and drinks V8 3 times per week.  We further discussed the DASH diet and importance of increasing physical activity.  He denies any chest pain or shortness of breath with these activities.  Weight gain, or early satiety..  Patient denies chest pain, palpitations, dyspnea, PND, orthopnea, nausea, vomiting, dizziness, syncope, edema,   Home Medications    Current Outpatient Medications  Medication Sig Dispense Refill    amLODipine (NORVASC) 5 MG tablet Take 1 tablet (5 mg total) by mouth daily. 90 tablet 3   aspirin EC 81 MG tablet Take 81 mg by mouth daily.     Bempedoic Acid-Ezetimibe (NEXLIZET) 180-10 MG TABS Take 1 tablet by mouth daily. 90 tablet 3   chlorthalidone (HYGROTON) 25 MG tablet Take 1 tablet (25 mg total) by mouth daily. 90 tablet 3   finasteride (PROSCAR) 5 MG tablet TAKE 1 TABLET BY MOUTH EVERY DAY 30 tablet 11   metoprolol succinate (TOPROL-XL) 50 MG 24 hr tablet Take 1.5 tablets (75 mg total) by mouth daily. Take with or immediately following a meal. 135 tablet 3   olmesartan (BENICAR) 40 MG tablet Take 40 mg by mouth daily.     omeprazole (PRILOSEC) 20 MG capsule Take 20 mg by mouth daily.     tamsulosin (FLOMAX) 0.4 MG CAPS capsule Take 0.4 mg by mouth daily.     No current facility-administered medications for this visit.     Review of Systems  Please see the history of present illness.    (+) Fatigue (+) Shortness of breath with heavy exertion  All other systems reviewed and are otherwise negative except as noted above.  Physical Exam    Wt Readings from Last 3 Encounters:  07/30/22 171 lb 9.6 oz (77.8 kg)  01/31/22 170 lb (77.1 kg)  11/15/21 161 lb 6 oz (73.2 kg)   [id number] were no vitals filed for this visit.,There is no height or weight on file to calculate BMI.  Constitutional:      Appearance: Healthy appearance. Not in distress.  Neck:     Vascular: JVD normal.  Pulmonary:     Effort: Pulmonary effort is normal.     Breath sounds: No wheezing. No rales. Diminished in the bases Cardiovascular:     Normal rate. Regular with palpitations. Normal S1. Normal S2.      Murmurs: There is no murmur.  Edema:    Peripheral edema absent.  Abdominal:     Palpations: Abdomen is soft non tender. There is no hepatomegaly.  Skin:    General: Skin is warm and dry.  Neurological:     General: No focal deficit present.     Mental Status: Alert and oriented to person, place  and time.     Cranial Nerves: Cranial nerves are intact.  EKG/LABS/Other Studies Reviewed    ECG personally reviewed by me today -sinus rhythm with sinus arrhythmia and possible LAE with rate of 72 bpm and no acute changes consistent with previous EKG.  Lab Results  Component Value Date   WBC 10.4 11/14/2021   HGB 16.8 11/14/2021   HCT 50.0 11/14/2021   MCV 86.4 11/14/2021   PLT 199 11/14/2021   Lab Results  Component Value Date   CREATININE 1.16 07/30/2022   BUN 20 07/30/2022   NA 140 07/30/2022   K 4.1 07/30/2022   CL 102 07/30/2022   CO2  24 07/30/2022   Lab Results  Component Value Date   ALT 45 (H) 07/24/2022   AST 37 07/24/2022   ALKPHOS 57 07/24/2022   BILITOT 0.4 07/24/2022   Lab Results  Component Value Date   CHOL 132 07/24/2022   HDL 31 (L) 07/24/2022   LDLCALC 81 07/24/2022   TRIG 104 07/24/2022   CHOLHDL 4.3 07/24/2022    No results found for: "HGBA1C"  Assessment & Plan    1.  History of coronary calcifications: -Aortic calcification seen on CT scan -Continue primary prevention with Nexlizet 180-10 mg and ASA 81 mg -We discussed the importance of primary prevention and patient is interested in the PREP and was given information with plan to contact us if he is interested in the future.   2.  Essential hypertension: -Blood pressure today was slightly elevated at 138/80 -Patient reports that he is abstaining from added salt to his food however does consume foods high in sodium such as V8 and cheese. -Continue Toprol-XL 50 mg, Norvasc 5 mg and chlorthalidone 25 mg -Patient will continue to monitor blood pressures     3.  History of PACs: -Today patient reports that palpitations are not symptomatic however are still occurring on auscultation and per EKG. -We will continue Toprol 75 mg daily -Patient will have 2D echo to evaluate for possible left atrial enlargement  4.  Infrarenal aortic dilation: -Mild focal dilatation of the infrarenal abdominal  aorta measuring up to 2.7 cm with mural thrombus  -Continue continue lipid-lowering therapy and ASA 81 mg   5.  Hyperlipidemia: -Patient currently followed by lipid clinic with lipids and LFTs being completed today. -Continue Nexlizet as noted above.  6.  Sleep disturbance: -Patient reports that he has frequent episodes of apnea per his wife and also experiences daytime somnolence -Patient will complete home sleep study to evaluate for possible sleep apnea.  -Sleep Apnea Evaluation  [facility]  Today's Date: 11/01/2022   Patient Name: Jeron Lavy        DOB: 08-07-1951       Height:  5' 6"$  (1.676 m)     Weight: 174 lb (78.9 kg)  BMI: Body mass index is 28.08 kg/m.    Referring Provider: Ambrose Pancoast, NP   STOP-BANG RISK ASSESSMENT       11/01/2022    8:13 AM  STOP-BANG  Do you snore loudly? Yes  Do you often feel tired, fatigued, or sleepy during the daytime? Yes  Has anyone observed you stop breathing during sleep? Yes  Do you have (or are you being treated for) high blood pressure? Yes  Recent BMI (Calculated) 28.1  Is BMI greater than 35 kg/m2? 0=No  Age older than 70 years old? 1=Yes  Has large neck size > 40 cm (15.7 in, large male shirt size, large male collar size > 16) Yes  Gender - Male 1=Yes  STOP-Bang Total Score 7      If STOP-BANG Score ?3 OR two clinical symptoms - patient qualifies for WatchPAT (CPT 95800)      Sleep study ordered due to two (2) of the following clinical symptoms/diagnoses:  Excessive daytime sleepiness G47.10  Gastroesophageal reflux K21.9  Nocturia R35.1  Morning Headaches G44.221  Difficulty concentrating R41.840  Memory problems or poor judgment G31.84  Personality changes or irritability R45.4  Loud snoring R06.83  Depression F32.9  Unrefreshed by sleep G47.8  Impotence N52.9  History of high blood pressure R03.0  Insomnia G47.00  Sleep  Disordered Breathing or Sleep Apnea ICD G47.33         Disposition: Follow-up with Candee Furbish, MD or APP in 1 months    Medication Adjustments/Labs and Tests Ordered: Current medicines are reviewed at length with the patient today.  Concerns regarding medicines are outlined above.   Signed, Mable Fill, Marissa Nestle, NP 10/31/2022, 7:35 AM [facility]  Note:  This document was prepared using Dragon voice recognition software and may include unintentional dictation errors.

## 2022-11-01 ENCOUNTER — Telehealth: Payer: Self-pay | Admitting: *Deleted

## 2022-11-01 ENCOUNTER — Ambulatory Visit: Payer: PPO | Attending: Nurse Practitioner | Admitting: Nurse Practitioner

## 2022-11-01 ENCOUNTER — Ambulatory Visit: Payer: PPO

## 2022-11-01 ENCOUNTER — Encounter: Payer: Self-pay | Admitting: Nurse Practitioner

## 2022-11-01 VITALS — BP 138/80 | HR 72 | Ht 66.0 in | Wt 174.0 lb

## 2022-11-01 DIAGNOSIS — R002 Palpitations: Secondary | ICD-10-CM | POA: Diagnosis not present

## 2022-11-01 DIAGNOSIS — G4700 Insomnia, unspecified: Secondary | ICD-10-CM | POA: Diagnosis not present

## 2022-11-01 DIAGNOSIS — I10 Essential (primary) hypertension: Secondary | ICD-10-CM | POA: Diagnosis not present

## 2022-11-01 DIAGNOSIS — G4719 Other hypersomnia: Secondary | ICD-10-CM

## 2022-11-01 DIAGNOSIS — I2584 Coronary atherosclerosis due to calcified coronary lesion: Secondary | ICD-10-CM

## 2022-11-01 DIAGNOSIS — I77819 Aortic ectasia, unspecified site: Secondary | ICD-10-CM

## 2022-11-01 DIAGNOSIS — E782 Mixed hyperlipidemia: Secondary | ICD-10-CM

## 2022-11-01 DIAGNOSIS — I2510 Atherosclerotic heart disease of native coronary artery without angina pectoris: Secondary | ICD-10-CM

## 2022-11-01 NOTE — Telephone Encounter (Signed)
Pt was seen in the office today by Ambrose Pancoast, NP who ordered an Itamar study. Pt aware to not open the box until he has been called and given the PIN#. Pt agreeable to signed waiver.

## 2022-11-01 NOTE — Patient Instructions (Addendum)
Medication Instructions:  Your physician recommends that you continue on your current medications as directed. Please refer to the Current Medication list given to you today. *If you need a refill on your cardiac medications before your next appointment, please call your pharmacy*   Lab Work: TODAY-LIPID & LFT If you have labs (blood work) drawn today and your tests are completely normal, you will receive your results only by: [place] (if you have MyChart) OR A paper copy in the mail If you have any lab test that is abnormal or we need to change your treatment, we will call you to review the results.   Testing/Procedures: Intamar  Your physician has requested that you have an echocardiogram. Echocardiography is a painless test that uses sound waves to create images of your heart. It provides your doctor with information about the size and shape of your heart and how well your heart's chambers and valves are working. This procedure takes approximately one hour. There are no restrictions for this procedure. Please do NOT wear cologne, perfume, aftershave, or lotions (deodorant is allowed). Please arrive 15 minutes prior to your appointment time.   Follow-Up: At [facility], you and your health needs are our priority.  As part of our continuing mission to provide you with exceptional heart care, we have created designated Provider Care Teams.  These Care Teams include your primary Cardiologist (physician) and Advanced Practice Providers (APPs -  Physician Assistants and Nurse Practitioners) who all work together to provide you with the care you need, when you need it.  We recommend signing up for the patient portal called "MyChart".  Sign up information is provided on this After Visit Summary.  MyChart is used to connect with patients for Virtual Visits (Telemedicine).  Patients are able to view lab/test results, encounter notes, upcoming appointments, etc.  Non-urgent messages  can be sent to your provider as well.   To learn more about what you can do with MyChart, go to [email or web address].    Your next appointment:   1 month(s)  Provider:   Ambrose Pancoast, NP       Other Instructions

## 2022-11-02 LAB — COMPREHENSIVE METABOLIC PANEL
ALT: 49 IU/L — ABNORMAL HIGH (ref 0–44)
AST: 37 IU/L (ref 0–40)
Albumin/Globulin Ratio: 1.7 (ref 1.2–2.2)
Albumin: 4.8 g/dL (ref 3.9–4.9)
Alkaline Phosphatase: 59 IU/L (ref 44–121)
BUN/Creatinine Ratio: 22 (ref 10–24)
BUN: 23 mg/dL (ref 8–27)
Bilirubin Total: 0.5 mg/dL (ref 0.0–1.2)
CO2: 23 mmol/L (ref 20–29)
Calcium: 9.9 mg/dL (ref 8.6–10.2)
Chloride: 104 mmol/L (ref 96–106)
Creatinine, Ser: 1.06 mg/dL (ref 0.76–1.27)
Globulin, Total: 2.8 g/dL (ref 1.5–4.5)
Glucose: 106 mg/dL — ABNORMAL HIGH (ref 70–99)
Potassium: 4.4 mmol/L (ref 3.5–5.2)
Sodium: 146 mmol/L — ABNORMAL HIGH (ref 134–144)
Total Protein: 7.6 g/dL (ref 6.0–8.5)
eGFR: 76 mL/min/{1.73_m2} (ref >59)

## 2022-11-02 LAB — LIPID PANEL
Chol/HDL Ratio: 4.3 ratio (ref 0.0–5.0)
Cholesterol, Total: 132 mg/dL (ref 100–199)
HDL: 31 mg/dL — ABNORMAL LOW (ref >39)
LDL Chol Calc (NIH): 71 mg/dL (ref 0–99)
Triglycerides: 175 mg/dL — ABNORMAL HIGH (ref 0–149)
VLDL Cholesterol Cal: 30 mg/dL (ref 5–40)

## 2022-11-06 DIAGNOSIS — L570 Actinic keratosis: Secondary | ICD-10-CM | POA: Diagnosis not present

## 2022-11-06 DIAGNOSIS — Z08 Encounter for follow-up examination after completed treatment for malignant neoplasm: Secondary | ICD-10-CM | POA: Diagnosis not present

## 2022-11-06 DIAGNOSIS — L578 Other skin changes due to chronic exposure to nonionizing radiation: Secondary | ICD-10-CM | POA: Diagnosis not present

## 2022-11-06 DIAGNOSIS — Z85828 Personal history of other malignant neoplasm of skin: Secondary | ICD-10-CM | POA: Diagnosis not present

## 2022-11-11 NOTE — Telephone Encounter (Signed)
Prior Authorization for [facility] sent to [employer] via web portal. Tracking Number . [id number]- Approved-11/06/2022 - 02/04/2023

## 2022-11-11 NOTE — Telephone Encounter (Signed)
Called and made the patient aware that he may proceed with the [facility] Sleep Study. PIN # provided to the patient. Patient made aware that he will be contacted after the test has been read with the results and any recommendations. Patient verbalized understanding and thanked me for the call.    Pt has been given PIN# [id number] and will do study one night this week, Pt aware we will call once results are in .

## 2022-11-13 ENCOUNTER — Encounter (INDEPENDENT_AMBULATORY_CARE_PROVIDER_SITE_OTHER): Payer: PPO | Admitting: Cardiology

## 2022-11-13 DIAGNOSIS — G4733 Obstructive sleep apnea (adult) (pediatric): Secondary | ICD-10-CM

## 2022-11-27 ENCOUNTER — Ambulatory Visit (HOSPITAL_COMMUNITY): Payer: PPO | Attending: Nurse Practitioner

## 2022-11-27 DIAGNOSIS — G4719 Other hypersomnia: Secondary | ICD-10-CM | POA: Insufficient documentation

## 2022-11-27 DIAGNOSIS — I10 Essential (primary) hypertension: Secondary | ICD-10-CM | POA: Insufficient documentation

## 2022-11-27 DIAGNOSIS — I77819 Aortic ectasia, unspecified site: Secondary | ICD-10-CM | POA: Diagnosis not present

## 2022-11-27 DIAGNOSIS — R002 Palpitations: Secondary | ICD-10-CM | POA: Insufficient documentation

## 2022-11-27 DIAGNOSIS — G4700 Insomnia, unspecified: Secondary | ICD-10-CM | POA: Insufficient documentation

## 2022-11-27 DIAGNOSIS — E782 Mixed hyperlipidemia: Secondary | ICD-10-CM | POA: Insufficient documentation

## 2022-11-27 DIAGNOSIS — I2584 Coronary atherosclerosis due to calcified coronary lesion: Secondary | ICD-10-CM | POA: Diagnosis not present

## 2022-11-27 DIAGNOSIS — I2510 Atherosclerotic heart disease of native coronary artery without angina pectoris: Secondary | ICD-10-CM | POA: Diagnosis not present

## 2022-11-27 LAB — ECHOCARDIOGRAM COMPLETE
Area-P 1/2: 2.91 cm2
S' Lateral: 2.3 cm

## 2022-11-28 ENCOUNTER — Telehealth: Payer: Self-pay

## 2022-11-28 DIAGNOSIS — H34832 Tributary (branch) retinal vein occlusion, left eye, with macular edema: Secondary | ICD-10-CM | POA: Diagnosis not present

## 2022-11-28 DIAGNOSIS — H35361 Drusen (degenerative) of macula, right eye: Secondary | ICD-10-CM | POA: Diagnosis not present

## 2022-11-28 DIAGNOSIS — H35033 Hypertensive retinopathy, bilateral: Secondary | ICD-10-CM | POA: Diagnosis not present

## 2022-11-28 DIAGNOSIS — D3132 Benign neoplasm of left choroid: Secondary | ICD-10-CM | POA: Diagnosis not present

## 2022-11-28 NOTE — Telephone Encounter (Signed)
Called patient to go over echo results and the patient had a question about his Itamar. He wanted to know if his test has been received and when the results will be available. I advised him that I would pass the message on to the sleep coordinator and she would get back to him. He voiced understanding.

## 2022-12-02 ENCOUNTER — Ambulatory Visit: Payer: PPO | Attending: Nurse Practitioner

## 2022-12-02 DIAGNOSIS — G4700 Insomnia, unspecified: Secondary | ICD-10-CM

## 2022-12-02 DIAGNOSIS — I77819 Aortic ectasia, unspecified site: Secondary | ICD-10-CM

## 2022-12-02 DIAGNOSIS — I10 Essential (primary) hypertension: Secondary | ICD-10-CM

## 2022-12-02 DIAGNOSIS — I2510 Atherosclerotic heart disease of native coronary artery without angina pectoris: Secondary | ICD-10-CM

## 2022-12-02 DIAGNOSIS — R002 Palpitations: Secondary | ICD-10-CM

## 2022-12-02 DIAGNOSIS — E782 Mixed hyperlipidemia: Secondary | ICD-10-CM

## 2022-12-02 DIAGNOSIS — G4719 Other hypersomnia: Secondary | ICD-10-CM

## 2022-12-02 NOTE — Procedures (Signed)
SLEEP STUDY REPORT Patient Information Study Date: 11/13/2022 Patient Name: Christopher Castillo Patient ID: [id number] Birth Date: [birth year] Age: 72 Gender: Male BMI: 28.0 (W=174 lb, H=5' 6'') Stopbang: 7 Referring Physician: Ambrose Pancoast, NP  TEST DESCRIPTION: Home sleep apnea testing was completed using the WatchPat, a Type 1 device, utilizing peripheral arterial tonometry (PAT), chest movement, actigraphy, pulse oximetry, pulse rate, body position and snore. AHI was calculated with apnea and hypopnea using valid sleep time as the denominator. RDI includes apneas, hypopneas, and RERAs. The data acquired and the scoring of sleep and all associated events were performed in accordance with the recommended standards and specifications as outlined in the AASM Manual for the Scoring of Sleep and Associated Events 2.2.0 (2015).   FINDINGS:   1. Mild Obstructive Sleep Apnea with AHI 7/hr with elevated RDI of 14.3/hr.  2. No Central Sleep Apnea with pAHIc 0.2/hr.   3. Oxygen desaturations as low as 84%.   4. Mild to moderate snoring was present. O2 sats were < 88% for 0.3 min.   5. Total sleep time was 5 hrs and 8 min.   6. 12.6% of total sleep time was spent in REM sleep.   7. Normal sleep onset latency at 16 min.   8. Prolonged REM sleep onset latency at 169 min.   9. Total awakenings were 13.  10. Arrhythmia detection:  None.  DIAGNOSIS: Mild Obstructive Sleep Apnea (G47.33)  RECOMMENDATIONS:   1.  Clinical correlation of these findings is necessary.  The decision to treat obstructive sleep apnea (OSA) is usually based on the presence of apnea symptoms or the presence of associated medical conditions such as Hypertension, Congestive Heart Failure, Atrial Fibrillation or Obesity.  The most common symptoms of OSA are snoring, gasping for breath while sleeping, daytime sleepiness and fatigue.   2.  Initiating apnea therapy is recommended given the presence of symptoms and/or associated  conditions. Recommend proceeding with one of the following:     a.  Auto-CPAP therapy with a pressure range of 5-20cm H2O.     b.  An oral appliance (OA) that can be obtained from certain dentists with expertise in sleep medicine.  These are primarily of use in non-obese patients with mild and moderate disease.     c.  An ENT consultation which may be useful to look for specific causes of obstruction and possible treatment options.     d.  If patient is intolerant to PAP therapy, consider referral to ENT for evaluation for hypoglossal nerve stimulator.   3.  Close follow-up is necessary to ensure success with CPAP or oral appliance therapy for maximum benefit.  4.  A follow-up oximetry study on CPAP is recommended to assess the adequacy of therapy and determine the need for supplemental oxygen or the potential need for Bi-level therapy.  An arterial blood gas to determine the adequacy of baseline ventilation and oxygenation should also be considered.  5.  Healthy sleep recommendations include:  adequate nightly sleep (normal 7-9 hrs/night), avoidance of caffeine after noon and alcohol near bedtime, and maintaining a sleep environment that is cool, dark and quiet.  6.  Weight loss for overweight patients is recommended.  Even modest amounts of weight loss can significantly improve the severity of sleep apnea.  7.  Snoring recommendations include:  weight loss where appropriate, side sleeping, and avoidance of alcohol before bed.  8.  Operation of motor vehicle should be avoided when sleepy.  Signature: Fransico Him, MD; [facility]; Diplomat,  American Board of Sleep Medicine Electronically Signed: 12/02/2022

## 2022-12-09 NOTE — Progress Notes (Unsigned)
Office Visit    Patient Name: Christopher Castillo Date of Encounter: 12/10/2022  Primary Care Provider:  Haywood Pao, MD Primary Cardiologist:  Candee Furbish, MD Primary Electrophysiologist: None  Chief Complaint    Christopher Castillo is a 72 y.o. 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   Past Medical History    Past Medical History:  Diagnosis Date   GERD (gastroesophageal reflux disease)    History of kidney stones    History of urinary tract infection    Hypertension    Past Surgical History:  Procedure Laterality Date   colonscopy     EXTRACORPOREAL SHOCK WAVE LITHOTRIPSY Right 12/12/2016   Procedure: RIGHT EXTRACORPOREAL SHOCK WAVE LITHOTRIPSY (ESWL);  Surgeon: Cleon Gustin, MD;  Location: [facility];  Service: Urology;  Laterality: Right;    Allergies  Allergies  Allergen Reactions   Rosuvastatin     Fatigue and joint pain on 5-20mg  daily    History of Present Illness    Christopher Castillo  is a 72 year old male with the above mention past medical history who presents today for 27-month follow-up of hypertension and palpitations.  Christopher Castillo was seen by Dr. Marlou Porch for consultation 11/2021 at request of his PCP for evaluation of PACs and tachycardia. He was seen in the ED with complaint of epigastric pain after eating greasy foods. CT of the abdomen was negative for dissection however revealed aortic calcification with small hiatal hernia and mural thrombus with infrarenal aorta measuring 2.7 cm.  His EKG in the ED revealed PACs with atrial bigeminy in the 100s to 113.  He has a family history of premature heart disease but denied any chest pain or dyspnea.  His blood pressures were elevated in the 123456 systolically and he was started on nitro drip.  He was previously on atenolol at home and this was restarted for atrial tach and he was switched to  olmesartan 40 mg for blood pressure control.  His PVCs improved after resuming atenolol and event monitor was not ordered at that time.  He was seen on 11/01/2022 for 6-month follow-up.  During visit patient was doing well with no new cardiac complaints and reported palpitations were barely noticeable.  He was interested in initiating an exercise program and was provided information for the PrEP program.  2D echo was completed to evaluate for structural heart changes due to increased palpitations.  He was also provided a home sleep study to evaluate for sleep apnea.  2D echo results revealed EF of 60 to 65% with no RWMA and mild concentric LVH with grade 1 DD and normal RV systolic function with trivial MVR and no evidence of aortic stenosis or regurgitation.   Christopher Castillo presents today for 1 month follow-up alone.  Since last being seen in the office patient reports that he has been doing well with no new cardiac complaints.  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  He is currently using a chemotherapy cream for the next 6 weeks.  His blood pressures have been controlled at home in the 120s over 70s.  Today in our office he has some whitecoat hypertension with blood pressure of 146/84 and was 136/88 on recheck.  He has reported some shortness of breath with increased physical activity but attributes this to possible deconditioning.  He  is euvolemic on exam and has no complaints of increased palpitations since his follow-up.  During our visit we discussed the importance of primary secondary prevention with increasing physical activity.  He was provided information for [facility] well here at [facility] and was provided enrollment information.  He also has a complaint of possible Baker's cyst behind his right knee.  He denies any prolonged car trips or traveling over the past few months.  Patient denies chest pain, palpitations, dyspnea, PND, orthopnea, nausea, vomiting,  dizziness, syncope, edema, weight gain, or early satiety.    Home Medications    Current Outpatient Medications  Medication Sig Dispense Refill   amLODipine (NORVASC) 5 MG tablet Take 1 tablet (5 mg total) by mouth daily. 90 tablet 3   aspirin EC 81 MG tablet Take 81 mg by mouth daily.     Bempedoic Acid-Ezetimibe (NEXLIZET) 180-10 MG TABS Take 1 tablet by mouth daily. 90 tablet 3   chlorthalidone (HYGROTON) 25 MG tablet Take 1 tablet (25 mg total) by mouth daily. 90 tablet 3   finasteride (PROSCAR) 5 MG tablet TAKE 1 TABLET BY MOUTH EVERY DAY 30 tablet 11   metoprolol succinate (TOPROL-XL) 50 MG 24 hr tablet Take 1.5 tablets (75 mg total) by mouth daily. Take with or immediately following a meal. 135 tablet 3   olmesartan (BENICAR) 40 MG tablet Take 40 mg by mouth daily.     omeprazole (PRILOSEC) 20 MG capsule Take 20 mg by mouth daily.     tamsulosin (FLOMAX) 0.4 MG CAPS capsule Take 0.4 mg by mouth daily.     No current facility-administered medications for this visit.     Review of Systems  Please see the history of present illness.    (+) Tingling and slight swelling behind right knee (+) Deconditioning  All other systems reviewed and are otherwise negative except as noted above.  Physical Exam    Wt Readings from Last 3 Encounters:  12/10/22 176 lb (79.8 kg)  11/01/22 174 lb (78.9 kg)  07/30/22 171 lb 9.6 oz (77.8 kg)   VS: Vitals:   12/10/22 0819  BP: (!) 146/84  Pulse: 86  SpO2: 97%  ,Body mass index is 28.41 kg/m.  Constitutional:      Appearance: Healthy appearance. Not in distress.  Neck:     Vascular: JVD normal.  Pulmonary:     Effort: Pulmonary effort is normal.     Breath sounds: No wheezing. No rales. Diminished in the bases Cardiovascular:     Normal rate. Regular rhythm. Normal S1. Normal S2.      Murmurs: There is no murmur.  Edema:    Peripheral edema absent.  Abdominal:     Palpations: Abdomen is soft non tender. There is no hepatomegaly.   Skin:    General: Skin is warm and dry.  Neurological:     General: No focal deficit present.     Mental Status: Alert and oriented to person, place and time.     Cranial Nerves: Cranial nerves are intact.  EKG/LABS/ Recent Cardiac Studies    ECG personally reviewed by me today -none completed today STOP-Bang Score:  7        Lab Results  Component Value Date   WBC 10.4 11/14/2021   HGB 16.8 11/14/2021   HCT 50.0 11/14/2021   MCV 86.4 11/14/2021   PLT 199 11/14/2021   Lab Results  Component Value Date   CREATININE 1.06 11/01/2022   BUN 23 11/01/2022  NA 146 (H) 11/01/2022   K 4.4 11/01/2022   CL 104 11/01/2022   CO2 23 11/01/2022   Lab Results  Component Value Date   ALT 49 (H) 11/01/2022   AST 37 11/01/2022   ALKPHOS 59 11/01/2022   BILITOT 0.5 11/01/2022   Lab Results  Component Value Date   CHOL 132 11/01/2022   HDL 31 (L) 11/01/2022   LDLCALC 71 11/01/2022   TRIG 175 (H) 11/01/2022   CHOLHDL 4.3 11/01/2022    No results found for: "HGBA1C"  Cardiac Studies & Procedures       ECHOCARDIOGRAM  ECHOCARDIOGRAM COMPLETE 11/27/2022  Narrative ECHOCARDIOGRAM REPORT    Patient Name:   Christopher Castillo  Date of Exam: 11/27/2022 Medical Rec #:  [id number]     Height:       66.0 in Accession #:    [id number]    Weight:       174.0 lb Date of Birth:  15-Sep-1951     BSA:          1.885 m Patient Age:    80 years      BP:           138/80 mmHg Patient Gender: M             HR:           70 bpm. Exam Location:  [place]  Procedure: 2D Echo, Cardiac Doppler and Color Doppler  Indications:    Palpitations R00.2  History:        Patient has no prior history of Echocardiogram examinations. Palpitations, Dilation of aorta, Arrythmias:PVC and PAC; Risk Factors:Hypertension and Dyslipidemia.  Sonographer:    Coralyn Helling RDCS Referring Phys: [place], JR Israa Caban  IMPRESSIONS   1. Left ventricular ejection fraction, by estimation, is 60 to 65%. The  left ventricle has normal function. The left ventricle has no regional wall motion abnormalities. There is mild concentric left ventricular hypertrophy. Left ventricular diastolic parameters are consistent with Grade I diastolic dysfunction (impaired relaxation). Elevated left ventricular end-diastolic pressure. 2. Right ventricular systolic function is normal. The right ventricular size is normal. There is normal pulmonary artery systolic pressure. 3. The mitral valve is normal in structure. Trivial mitral valve regurgitation. No evidence of mitral stenosis. 4. The aortic valve is tricuspid. Aortic valve regurgitation is not visualized. No aortic stenosis is present. 5. The inferior vena cava is normal in size with greater than 50% respiratory variability, suggesting right atrial pressure of 3 mmHg.  FINDINGS Left Ventricle: Left ventricular ejection fraction, by estimation, is 60 to 65%. The left ventricle has normal function. The left ventricle has no regional wall motion abnormalities. The left ventricular internal cavity size was normal in size. There is mild concentric left ventricular hypertrophy. Left ventricular diastolic parameters are consistent with Grade I diastolic dysfunction (impaired relaxation). Elevated left ventricular end-diastolic pressure.  Right Ventricle: The right ventricular size is normal. No increase in right ventricular wall thickness. Right ventricular systolic function is normal. There is normal pulmonary artery systolic pressure. The tricuspid regurgitant velocity is 2.23 m/s, and with an assumed right atrial pressure of 3 mmHg, the estimated right ventricular systolic pressure is [id number] mmHg.  Left Atrium: Left atrial size was normal in size.  Right Atrium: Right atrial size was normal in size.  Pericardium: There is no evidence of pericardial effusion.  Mitral Valve: The mitral valve is normal in structure. Trivial mitral valve regurgitation. No evidence of mitral  valve stenosis.  Tricuspid Valve: The tricuspid valve is normal in structure. Tricuspid valve regurgitation is trivial. No evidence of tricuspid stenosis.  Aortic Valve: The aortic valve is tricuspid. Aortic valve regurgitation is not visualized. No aortic stenosis is present.  Pulmonic Valve: The pulmonic valve was normal in structure. Pulmonic valve regurgitation is trivial. No evidence of pulmonic stenosis.  Aorta: The aortic root is normal in size and structure.  Venous: The inferior vena cava is normal in size with greater than 50% respiratory variability, suggesting right atrial pressure of 3 mmHg.  IAS/Shunts: No atrial level shunt detected by color flow Doppler.   LEFT VENTRICLE PLAX 2D LVIDd:         3.80 cm   Diastology LVIDs:         2.30 cm   LV e' medial:    5.00 cm/s LV PW:         1.30 cm   LV E/e' medial:  14.9 LV IVS:        1.30 cm   LV e' lateral:   8.81 cm/s LVOT diam:     2.00 cm   LV E/e' lateral: 8.4 LV SV:         65 LV SV Index:   34 LVOT Area:     3.14 cm   RIGHT VENTRICLE             IVC RV S prime:     11.70 cm/s  IVC diam: 1.20 cm TAPSE (M-mode): 1.6 cm RVSP:           22.9 mmHg  LEFT ATRIUM             Index        RIGHT ATRIUM           Index LA diam:        3.60 cm 1.91 cm/m   RA Pressure: 3.00 mmHg LA Vol (A2C):   51.8 ml 27.48 ml/m  RA Area:     9.98 cm LA Vol (A4C):   41.3 ml 21.91 ml/m  RA Volume:   21.40 ml  11.35 ml/m LA Biplane Vol: 48.2 ml 25.57 ml/m AORTIC VALVE LVOT Vmax:   95.00 cm/s LVOT Vmean:  59.700 cm/s LVOT VTI:    0.206 m  AORTA Ao Root diam: 3.40 cm Ao Asc diam:  3.40 cm  MITRAL VALVE                TRICUSPID VALVE MV Area (PHT): 2.91 cm     TR Peak grad:   19.9 mmHg MV Decel Time: 261 msec     TR Vmax:        223.00 cm/s MV E velocity: 74.40 cm/s   Estimated RAP:  3.00 mmHg MV A velocity: 103.00 cm/s  RVSP:           22.9 mmHg MV E/A ratio:  0.72 SHUNTS Systemic VTI:  0.21 m Systemic Diam: 2.00  cm  Skeet Latch MD Electronically signed by Skeet Latch MD Signature Date/Time: 11/27/2022/10:14:38 AM    Final             Assessment & Plan    1.  History of coronary calcifications: -Aortic calcification seen on CT scan -Continue primary prevention with Nexlizet 180-10 mg and ASA 81 mg -Today patient was offered information for [employer] well fitness class and is interested in enrolling in the future.    2.  Essential hypertension: -Blood pressure today was slightly elevated at 146/84 and was down  to 136/88 on recheck. -Patient reports that he is abstaining from added salt in his diet. -Continue Toprol-XL 50 mg, Norvasc 5 mg and chlorthalidone 25 mg -Patient will continue to monitor blood pressures at home for now.   3.  History of PACs: -Patient had 2D echo completed last month that showed EF of 60 to 65% with trivial MVR and no structural heart abnormalities. -Today patient reports that his palpitations are not noticeable -We will continue Toprol 75 mg daily    4.  Infrarenal aortic dilation: -Mild focal dilatation of the infrarenal abdominal aorta measuring up to 2.7 cm with mural thrombus  -Continue continue lipid-lowering therapy and ASA 81 mg   5.  Hyperlipidemia: -Patient currently followed by lipid clinic with lipids and LFTs being completed today. -Continue Nexlizet as noted above.   6.  Sleep disturbance: -Patient completed a home sleep study which revealed mild sleep apnea and recommendation for oral appliance or CPAP.  He was advised to contact his dentist for further information regarding oral appliance and will receive more information from our sleep center regarding titration of CPAP.  Disposition: Follow-up with Candee Furbish, MD or APP in 6 months    Medication Adjustments/Labs and Tests Ordered: Current medicines are reviewed at length with the patient today.  Concerns regarding medicines are outlined above.   Signed, Mable Fill, Marissa Nestle,  NP 12/10/2022, 8:41 AM [facility]  Note:  This document was prepared using Dragon voice recognition software and may include unintentional dictation errors.

## 2022-12-10 ENCOUNTER — Ambulatory Visit: Payer: PPO | Attending: Nurse Practitioner | Admitting: Nurse Practitioner

## 2022-12-10 ENCOUNTER — Encounter: Payer: Self-pay | Admitting: Nurse Practitioner

## 2022-12-10 VITALS — BP 136/88 | HR 86 | Ht 66.0 in | Wt 176.0 lb

## 2022-12-10 DIAGNOSIS — I10 Essential (primary) hypertension: Secondary | ICD-10-CM | POA: Diagnosis not present

## 2022-12-10 DIAGNOSIS — I2510 Atherosclerotic heart disease of native coronary artery without angina pectoris: Secondary | ICD-10-CM

## 2022-12-10 DIAGNOSIS — I77819 Aortic ectasia, unspecified site: Secondary | ICD-10-CM | POA: Diagnosis not present

## 2022-12-10 DIAGNOSIS — I2584 Coronary atherosclerosis due to calcified coronary lesion: Secondary | ICD-10-CM | POA: Diagnosis not present

## 2022-12-10 DIAGNOSIS — G4719 Other hypersomnia: Secondary | ICD-10-CM

## 2022-12-10 DIAGNOSIS — E782 Mixed hyperlipidemia: Secondary | ICD-10-CM

## 2022-12-10 NOTE — Patient Instructions (Signed)
Medication Instructions:  Your physician recommends that you continue on your current medications as directed. Please refer to the Current Medication list given to you today. *If you need a refill on your cardiac medications before your next appointment, please call your pharmacy*   Lab Work: None ordered If you have labs (blood work) drawn today and your tests are completely normal, you will receive your results only by: [place] (if you have MyChart) OR A paper copy in the mail If you have any lab test that is abnormal or we need to change your treatment, we will call you to review the results.   Testing/Procedures: None ordered   Follow-Up: At [facility], you and your health needs are our priority.  As part of our continuing mission to provide you with exceptional heart care, we have created designated Provider Care Teams.  These Care Teams include your primary Cardiologist (physician) and Advanced Practice Providers (APPs -  Physician Assistants and Nurse Practitioners) who all work together to provide you with the care you need, when you need it.  We recommend signing up for the patient portal called "MyChart".  Sign up information is provided on this After Visit Summary.  MyChart is used to connect with patients for Virtual Visits (Telemedicine).  Patients are able to view lab/test results, encounter notes, upcoming appointments, etc.  Non-urgent messages can be sent to your provider as well.   To learn more about what you can do with MyChart, go to [email or web address].    Your next appointment:   6 month(s)  Provider:   Candee Furbish, MD     Other Instructions Check your blood pressure daily for 4 weeks, then contact the office with your readings.   Please monitor blood pressures and keep a log of your readings.   Make sure to check 2 hours after your medications.   AVOID these things for 30 minutes before checking your blood pressure: No Drinking  caffeine. No Drinking alcohol. No Eating. No Smoking. No Exercising.  Five minutes before checking your blood pressure: Pee. Sit in a dining chair. Avoid sitting in a soft couch or armchair. Be quiet. Do not talk.

## 2022-12-16 ENCOUNTER — Telehealth: Payer: Self-pay | Admitting: *Deleted

## 2022-12-16 DIAGNOSIS — E782 Mixed hyperlipidemia: Secondary | ICD-10-CM

## 2022-12-16 DIAGNOSIS — G4733 Obstructive sleep apnea (adult) (pediatric): Secondary | ICD-10-CM

## 2022-12-16 DIAGNOSIS — G4719 Other hypersomnia: Secondary | ICD-10-CM

## 2022-12-16 NOTE — Telephone Encounter (Signed)
-----   Message from Lauralee Evener, [place] sent at 12/02/2022  8:41 AM EDT -----  ----- Message ----- From: Sueanne Margarita, MD Sent: 12/02/2022   7:21 AM EDT To: Cv Div Sleep Studies  Please let patient know that they have sleep apnea and recommend treating with CPAP.  Please order an auto CPAP from 4-15cm H2O with heated humidity and mask of choice.  Order overnight pulse ox on CPAP.  Followup with me in 6 weeks.

## 2022-12-16 NOTE — Telephone Encounter (Signed)
The patient has been notified of the result and verbalized understanding.  All questions (if any) were answered. Marolyn Hammock, [place] 12/16/2022 6:19 PM    Upon patient request DME selection is [facility]. Patient understands he will be contacted by [facility] to set up his cpap. Patient understands to call if [facility] does not contact him with new setup in a timely manner. Patient understands they will be called once confirmation has been received from [facility]/ that they have received their new machine to schedule 10 week follow up appointment.   [facility] notified of new cpap order  Please add to airview Patient was grateful for the call and thanked me.

## 2022-12-18 DIAGNOSIS — L578 Other skin changes due to chronic exposure to nonionizing radiation: Secondary | ICD-10-CM | POA: Diagnosis not present

## 2022-12-19 ENCOUNTER — Encounter: Payer: Self-pay | Admitting: Pharmacist

## 2022-12-20 ENCOUNTER — Other Ambulatory Visit (HOSPITAL_COMMUNITY): Payer: Self-pay

## 2022-12-20 ENCOUNTER — Telehealth: Payer: Self-pay

## 2022-12-20 NOTE — Telephone Encounter (Signed)
Pharmacy Patient Advocate Encounter   Received notification from Pharmd- that prior authorization for Nexlizet 180 MG - 10 MG  is required/requested.     PA submitted on 4.5.24 to (ins) [employer] AD[employer] via Direct call/verbally confirmation # [id number] Status is pending

## 2022-12-23 NOTE — Telephone Encounter (Signed)
Pt notified of approval in mychart message.

## 2022-12-23 NOTE — Telephone Encounter (Signed)
Pharmacy Patient Advocate Encounter  Prior Authorization for Nexlizet 180mg -10mg  tablet has been Approved by [employer] (ins).     Effective dates: 4.5.24 through 4.5.25

## 2022-12-23 NOTE — Telephone Encounter (Signed)
Patients test has not been resulted yet will contact him when I get it.

## 2022-12-25 DIAGNOSIS — K219 Gastro-esophageal reflux disease without esophagitis: Secondary | ICD-10-CM | POA: Diagnosis not present

## 2022-12-25 DIAGNOSIS — R7989 Other specified abnormal findings of blood chemistry: Secondary | ICD-10-CM | POA: Diagnosis not present

## 2022-12-25 DIAGNOSIS — E781 Pure hyperglyceridemia: Secondary | ICD-10-CM | POA: Diagnosis not present

## 2022-12-25 DIAGNOSIS — Z1212 Encounter for screening for malignant neoplasm of rectum: Secondary | ICD-10-CM | POA: Diagnosis not present

## 2022-12-25 DIAGNOSIS — Z125 Encounter for screening for malignant neoplasm of prostate: Secondary | ICD-10-CM | POA: Diagnosis not present

## 2022-12-25 DIAGNOSIS — I10 Essential (primary) hypertension: Secondary | ICD-10-CM | POA: Diagnosis not present

## 2023-01-02 DIAGNOSIS — G4733 Obstructive sleep apnea (adult) (pediatric): Secondary | ICD-10-CM | POA: Diagnosis not present

## 2023-01-02 DIAGNOSIS — Z0000 Encounter for general adult medical examination without abnormal findings: Secondary | ICD-10-CM | POA: Diagnosis not present

## 2023-01-02 DIAGNOSIS — E781 Pure hyperglyceridemia: Secondary | ICD-10-CM | POA: Diagnosis not present

## 2023-01-02 DIAGNOSIS — I10 Essential (primary) hypertension: Secondary | ICD-10-CM | POA: Diagnosis not present

## 2023-01-02 DIAGNOSIS — N401 Enlarged prostate with lower urinary tract symptoms: Secondary | ICD-10-CM | POA: Diagnosis not present

## 2023-01-02 DIAGNOSIS — J309 Allergic rhinitis, unspecified: Secondary | ICD-10-CM | POA: Diagnosis not present

## 2023-01-02 DIAGNOSIS — L570 Actinic keratosis: Secondary | ICD-10-CM | POA: Diagnosis not present

## 2023-01-02 DIAGNOSIS — E663 Overweight: Secondary | ICD-10-CM | POA: Diagnosis not present

## 2023-01-02 DIAGNOSIS — Z1339 Encounter for screening examination for other mental health and behavioral disorders: Secondary | ICD-10-CM | POA: Diagnosis not present

## 2023-01-02 DIAGNOSIS — Z1331 Encounter for screening for depression: Secondary | ICD-10-CM | POA: Diagnosis not present

## 2023-01-02 DIAGNOSIS — K219 Gastro-esophageal reflux disease without esophagitis: Secondary | ICD-10-CM | POA: Diagnosis not present

## 2023-02-13 DIAGNOSIS — G4733 Obstructive sleep apnea (adult) (pediatric): Secondary | ICD-10-CM | POA: Diagnosis not present

## 2023-02-28 DIAGNOSIS — N401 Enlarged prostate with lower urinary tract symptoms: Secondary | ICD-10-CM | POA: Diagnosis not present

## 2023-03-07 DIAGNOSIS — R972 Elevated prostate specific antigen [PSA]: Secondary | ICD-10-CM | POA: Diagnosis not present

## 2023-03-07 DIAGNOSIS — N401 Enlarged prostate with lower urinary tract symptoms: Secondary | ICD-10-CM | POA: Diagnosis not present

## 2023-03-07 DIAGNOSIS — R351 Nocturia: Secondary | ICD-10-CM | POA: Diagnosis not present

## 2023-03-16 DIAGNOSIS — G4733 Obstructive sleep apnea (adult) (pediatric): Secondary | ICD-10-CM | POA: Diagnosis not present

## 2023-03-21 DIAGNOSIS — G4733 Obstructive sleep apnea (adult) (pediatric): Secondary | ICD-10-CM | POA: Diagnosis not present

## 2023-03-31 DIAGNOSIS — H35361 Drusen (degenerative) of macula, right eye: Secondary | ICD-10-CM | POA: Diagnosis not present

## 2023-03-31 DIAGNOSIS — H35033 Hypertensive retinopathy, bilateral: Secondary | ICD-10-CM | POA: Diagnosis not present

## 2023-03-31 DIAGNOSIS — D3132 Benign neoplasm of left choroid: Secondary | ICD-10-CM | POA: Diagnosis not present

## 2023-03-31 DIAGNOSIS — H34832 Tributary (branch) retinal vein occlusion, left eye, with macular edema: Secondary | ICD-10-CM | POA: Diagnosis not present

## 2023-04-21 ENCOUNTER — Ambulatory Visit: Payer: PPO | Attending: Cardiology | Admitting: Cardiology

## 2023-04-21 ENCOUNTER — Encounter: Payer: Self-pay | Admitting: Cardiology

## 2023-04-21 DIAGNOSIS — I10 Essential (primary) hypertension: Secondary | ICD-10-CM | POA: Diagnosis not present

## 2023-04-21 DIAGNOSIS — G4733 Obstructive sleep apnea (adult) (pediatric): Secondary | ICD-10-CM

## 2023-04-21 MED ORDER — FLUTICASONE PROPIONATE 50 MCG/ACT NA SUSP: 1.0000 | Freq: Every day | NASAL | Status: AC

## 2023-04-21 MED ORDER — SALINE SPRAY 0.2 % NA SOLN: 2.0000 | Freq: Two times a day (BID) | NASAL | Status: AC

## 2023-04-21 NOTE — Addendum Note (Signed)
Addended by: Alvin Critchley A on: 04/21/2023 11:20 AM   Modules accepted: Orders

## 2023-04-21 NOTE — Progress Notes (Signed)
SLEEP MEDICINE VIRTUAL CONSULT NOTE  via Video Note   Because of Christopher Castillo's co-morbid illnesses, he is at least at moderate risk for complications without adequate follow up.  This format is felt to be most appropriate for this patient at this time.  All issues noted in this document were discussed and addressed.  A limited physical exam was performed with this format.  Please refer to the patient's chart for his consent to telehealth for [facility].      Date:  04/21/2023   ID:  Christopher Castillo, DOB 1951/04/17, MRN [id number] The patient was identified using 2 identifiers.  Patient Location: Home Provider Location: Home Office   PCP:  Tisovec, Adelfa Koh, MD   [facility] Providers Cardiologist:  Christopher Schultz, MD     Evaluation Performed:  New Patient Evaluation  Chief Complaint:  OSA  History of Present Illness:    Christopher Castillo is a 72 y.o. male who is being seen today for the evaluation of OSA at the request of Christopher Schultz, MD.  Christopher Castillo is a 72 y.o. male with a history of GERD, hypertension.  He was last seen by Christopher Searing, NP in February 2024 for reported that he was having frequent episodes of witnessed apneas during sleep by his wife.  He was also complaining that he was really sleepy during the day.  He says that if he would sit and watch TV during the day, he would fall asleep easily.  He says for years he has had a lot of problems with nasal congestion and his PCP has been treating him.  When he lays down to go to sleep at night his nose completely stops up.  He tried nasal saline spray during the night which has not helped.  He has never seen an ENT.  A home sleep study was ordered which showed mild obstructive sleep apnea overall with an AHI of 7/h but an elevated RDI of 14.3/h.  He was started on auto CPAP from 4 to 15 cm H2O.  He is now referred for sleep medicine consultation to establish sleep care and treatment of his sleep apnea.  Since  starting CPAP he has had problems again with nasal congestion.  He starts on his back to sleep but usually sleeps on his side.  He has tried several masks and when he first started out he had a FFM which he used for a month but he still has problems with feeling like he could not breathe.  He eventually went to his recliner and has been sleeping there for the past 2 months in order to be able to breathe with the FFM as his nose does not stop up as much.  He is waking up a lot during the night still to adjust the mask.  Currently he is using the under the nose FFM.  He still has problems with the mask leaking but also cannot breathe because of his nose stopped up.     Past Medical History:  Diagnosis Date   GERD (gastroesophageal reflux disease)    History of kidney stones    History of urinary tract infection    Hypertension    OSA (obstructive sleep apnea)    AHI 7/hr, RDI 14.3/hr.  On auto CPAP 4-15cm H2O.   Past Surgical History:  Procedure Laterality Date   colonscopy     EXTRACORPOREAL SHOCK WAVE LITHOTRIPSY Right 12/12/2016   Procedure: RIGHT EXTRACORPOREAL SHOCK WAVE LITHOTRIPSY (  ESWL);  Surgeon: Christopher Gauze, MD;  Location: [facility];  Service: Urology;  Laterality: Right;     No outpatient medications have been marked as taking for the 04/21/23 encounter (Video Visit) with Christopher Reichert, MD.     Allergies:   Rosuvastatin   Social History   Tobacco Use   Smoking status: Never   Smokeless tobacco: Never  Substance Use Topics   Alcohol use: No   Drug use: No     Family Hx: The patient's family history is not on file.  ROS:   Please see the history of present illness.     All other systems reviewed and are negative.   Prior Sleep studies:   The following studies were reviewed today:  Home sleep study and PAP compliance download  Labs/Other Tests and Data Reviewed:     Recent Labs: 11/01/2022: ALT 49; BUN 23; Creatinine, Ser 1.06; Potassium 4.4; Sodium 146     Wt Readings from Last 3 Encounters:  12/10/22 176 lb (79.8 kg)  11/01/22 174 lb (78.9 kg)  07/30/22 171 lb 9.6 oz (77.8 kg)     Risk Assessment/Calculations:      STOP-Bang Score:  7      Objective:    Vital Signs:  There were no vitals taken for this visit.   VITAL SIGNS:  reviewed GEN:  no acute distress EYES:  sclerae anicteric, EOMI - Extraocular Movements Intact RESPIRATORY:  normal respiratory effort, symmetric expansion CARDIOVASCULAR:  no peripheral edema SKIN:  no rash, lesions or ulcers. MUSCULOSKELETAL:  no obvious deformities. NEURO:  alert and oriented x 3, no obvious focal deficit PSYCH:  normal affect  ASSESSMENT & PLAN:    OSA - The PAP download performed by his DME was personally reviewed and interpreted by me today and showed an AHI of 5.6/hr on auto CPAP from 4 to 15 cm H2O with 97% compliance in using more than 4 hours nightly.  The patient has been using and benefiting from PAP use and will continue to benefit from therapy.  -he has a large mask leak on current mask -he is really struggling with his PAP device because he cannot find a mask that does not leak and also bad problems with nasal congestion and cannot lay in bed because his nose immediately closes off. He has tried nasal saline spray -I have recommended that he try Flonase 1 spray each nostril daily (take after using the nasal saline spray) -refer to ENT>>Dr. Jenne Castillo ASAP due to severe nasal obstructive and cannot use his CPAP>>I think he has a nasal obstruction -if there is surgical need from nasal obstruction, then would recommend repeat sleep study after surgery to reassess for OSA.   -If he does not have any nasal obstruction that requires surgery then will refer for oral device with Sleep dentistry  HTN -BP controlled on exam today -Continue prescription drug managed with amlodipine 5 mg daily, chlorthalidone 25 mg daily, Toprol XL 75 mg daily and olmesartan 40 mg daily with as needed  refills   Time:   Today, I have spent 25 minutes with the patient with telehealth technology discussing the above problems.     Medication Adjustments/Labs and Tests Ordered: Current medicines are reviewed at length with the patient today.  Concerns regarding medicines are outlined above.   Tests Ordered: No orders of the defined types were placed in this encounter.   Medication Changes: No orders of the defined types were placed in this encounter.  Follow Up:  In Person in 1 year(s)  Signed, Armanda Magic, MD  04/21/2023 9:49 AM    [facility]

## 2023-04-21 NOTE — Patient Instructions (Signed)
Medication Instructions:  Your physician has recommended you make the following change in your medication:   Saline nasal spray, 2 sprays each nostril 2 times daily Flonase 1 spray each nostril, daily  *If you need a refill on your cardiac medications before your next appointment, please call your pharmacy*  Follow-Up: At [facility], you and your health needs are our priority.  As part of our continuing mission to provide you with exceptional heart care, we have created designated Provider Care Teams.  These Care Teams include your primary Cardiologist (physician) and Advanced Practice Providers (APPs -  Physician Assistants and Nurse Practitioners) who all work together to provide you with the care you need, when you need it.    Your next appointment:   2 month(s)  Provider:   Dr Mayford Knife

## 2023-04-23 ENCOUNTER — Encounter: Payer: Self-pay | Admitting: Cardiology

## 2023-04-23 DIAGNOSIS — L578 Other skin changes due to chronic exposure to nonionizing radiation: Secondary | ICD-10-CM | POA: Diagnosis not present

## 2023-04-23 DIAGNOSIS — L570 Actinic keratosis: Secondary | ICD-10-CM | POA: Diagnosis not present

## 2023-04-29 DIAGNOSIS — G4733 Obstructive sleep apnea (adult) (pediatric): Secondary | ICD-10-CM | POA: Diagnosis not present

## 2023-05-07 DIAGNOSIS — G4733 Obstructive sleep apnea (adult) (pediatric): Secondary | ICD-10-CM | POA: Diagnosis not present

## 2023-05-13 DIAGNOSIS — G4733 Obstructive sleep apnea (adult) (pediatric): Secondary | ICD-10-CM | POA: Diagnosis not present

## 2023-05-15 ENCOUNTER — Telehealth: Payer: Self-pay | Admitting: Cardiology

## 2023-05-15 NOTE — Telephone Encounter (Signed)
Christopher Castillo with Medical Records states their department received a request for an [place] sleep study completed in March. She states they do not have access to the study and would like to have it faxed to [phone].

## 2023-05-16 DIAGNOSIS — M109 Gout, unspecified: Secondary | ICD-10-CM | POA: Diagnosis not present

## 2023-05-16 DIAGNOSIS — M79674 Pain in right toe(s): Secondary | ICD-10-CM | POA: Diagnosis not present

## 2023-05-27 DIAGNOSIS — G4733 Obstructive sleep apnea (adult) (pediatric): Secondary | ICD-10-CM | POA: Diagnosis not present

## 2023-05-30 DIAGNOSIS — G4733 Obstructive sleep apnea (adult) (pediatric): Secondary | ICD-10-CM | POA: Diagnosis not present

## 2023-06-05 ENCOUNTER — Emergency Department (HOSPITAL_BASED_OUTPATIENT_CLINIC_OR_DEPARTMENT_OTHER): Payer: PPO

## 2023-06-05 ENCOUNTER — Other Ambulatory Visit (HOSPITAL_BASED_OUTPATIENT_CLINIC_OR_DEPARTMENT_OTHER): Payer: Self-pay

## 2023-06-05 ENCOUNTER — Encounter (HOSPITAL_BASED_OUTPATIENT_CLINIC_OR_DEPARTMENT_OTHER): Payer: Self-pay

## 2023-06-05 ENCOUNTER — Other Ambulatory Visit: Payer: Self-pay

## 2023-06-05 ENCOUNTER — Emergency Department (HOSPITAL_BASED_OUTPATIENT_CLINIC_OR_DEPARTMENT_OTHER)
Admission: EM | Admit: 2023-06-05 | Discharge: 2023-06-05 | Disposition: A | Discharge status: Home / Self Care | Payer: PPO | Attending: Emergency Medicine | Admitting: Emergency Medicine

## 2023-06-05 DIAGNOSIS — K5792 Diverticulitis of intestine, part unspecified, without perforation or abscess without bleeding: Secondary | ICD-10-CM | POA: Diagnosis not present

## 2023-06-05 DIAGNOSIS — R1032 Left lower quadrant pain: Secondary | ICD-10-CM | POA: Diagnosis present

## 2023-06-05 DIAGNOSIS — K5732 Diverticulitis of large intestine without perforation or abscess without bleeding: Secondary | ICD-10-CM | POA: Diagnosis not present

## 2023-06-05 DIAGNOSIS — Z7982 Long term (current) use of aspirin: Secondary | ICD-10-CM | POA: Insufficient documentation

## 2023-06-05 DIAGNOSIS — I7143 Infrarenal abdominal aortic aneurysm, without rupture: Secondary | ICD-10-CM | POA: Insufficient documentation

## 2023-06-05 DIAGNOSIS — K8020 Calculus of gallbladder without cholecystitis without obstruction: Secondary | ICD-10-CM | POA: Insufficient documentation

## 2023-06-05 DIAGNOSIS — K449 Diaphragmatic hernia without obstruction or gangrene: Secondary | ICD-10-CM | POA: Diagnosis not present

## 2023-06-05 DIAGNOSIS — Z87442 Personal history of urinary calculi: Secondary | ICD-10-CM | POA: Diagnosis not present

## 2023-06-05 DIAGNOSIS — N400 Enlarged prostate without lower urinary tract symptoms: Secondary | ICD-10-CM | POA: Diagnosis not present

## 2023-06-05 LAB — URINALYSIS, ROUTINE W REFLEX MICROSCOPIC
Bilirubin Urine: NEGATIVE
Glucose, UA: NEGATIVE mg/dL
Hgb urine dipstick: NEGATIVE
Ketones, ur: NEGATIVE mg/dL
Leukocytes,Ua: NEGATIVE
Nitrite: NEGATIVE
Specific Gravity, Urine: 1.024 (ref 1.005–1.030)
pH: 7 (ref 5.0–8.0)

## 2023-06-05 MED ORDER — AMOXICILLIN-POT CLAVULANATE 875-125 MG PO TABS
1.0000 | ORAL_TABLET | Freq: Once | ORAL | Status: AC
  Administered 2023-06-05: 1 via ORAL
  Filled 2023-06-05: qty 1

## 2023-06-05 MED ORDER — IBUPROFEN 400 MG PO TABS
400.0000 mg | ORAL_TABLET | Freq: Once | ORAL | Status: AC
  Administered 2023-06-05: 400 mg via ORAL
  Filled 2023-06-05: qty 1

## 2023-06-05 MED ORDER — AMOXICILLIN-POT CLAVULANATE 875-125 MG PO TABS: 1.0000 | ORAL_TABLET | Freq: Two times a day (BID) | ORAL | 0 refills | Status: DC

## 2023-06-05 MED ORDER — ACETAMINOPHEN 500 MG PO TABS
1000.0000 mg | ORAL_TABLET | Freq: Once | ORAL | Status: AC
  Administered 2023-06-05: 1000 mg via ORAL
  Filled 2023-06-05: qty 2

## 2023-06-05 NOTE — ED Provider Notes (Signed)
EMERGENCY DEPARTMENT AT [facility] Provider Note   CSN: [id number] Arrival date & time: 06/05/23  2130     History  Chief Complaint  Patient presents with   Abdominal Pain    Christopher Castillo is a 72 y.o. male.  Pt c/o pain to left groin, extreme left lower abdomen. Hx kidney stones, unsure of similar. No hx diverticulitis. No hematuria or dysuria. Having bms. No vomiting. No fever or chills. No trauma or strain to area. No back pain or radicular pain.   The history is provided by the patient, medical records and the spouse.  Abdominal Pain Associated symptoms: no chest pain, no dysuria, no fever, no hematuria, no shortness of breath and no vomiting        Home Medications Prior to Admission medications   Medication Sig Start Date End Date Taking? Authorizing Provider  amLODipine (NORVASC) 5 MG tablet Take 1 tablet (5 mg total) by mouth daily. 10/10/22   Jake Bathe, MD  aspirin EC 81 MG tablet Take 81 mg by mouth daily.    [provider]  Bempedoic Acid-Ezetimibe (NEXLIZET) 180-10 MG TABS Take 1 tablet by mouth daily. 09/18/22   Jake Bathe, MD  chlorthalidone (HYGROTON) 25 MG tablet Take 1 tablet (25 mg total) by mouth daily. 09/18/22   Jake Bathe, MD  finasteride (PROSCAR) 5 MG tablet TAKE 1 TABLET BY MOUTH EVERY DAY 03/30/20   McKenzie, Mardene Celeste, MD  fluticasone (FLONASE) 50 MCG/ACT nasal spray Place 1 spray into both nostrils daily. 04/21/23   Quintella Reichert, MD  metoprolol succinate (TOPROL-XL) 50 MG 24 hr tablet Take 1.5 tablets (75 mg total) by mouth daily. Take with or immediately following a meal. 09/18/22   Jake Bathe, MD  olmesartan (BENICAR) 40 MG tablet Take 40 mg by mouth daily. 12/19/21   [provider]  omeprazole (PRILOSEC) 20 MG capsule Take 20 mg by mouth daily.    [provider]  Saline Spray 0.2 % SOLN Place 2 sprays into the nose in the morning and at bedtime. 04/21/23   Quintella Reichert, MD  tamsulosin  (FLOMAX) 0.4 MG CAPS capsule Take 0.4 mg by mouth daily. 02/11/19   [provider]      Allergies    Rosuvastatin    Review of Systems   Review of Systems  Constitutional:  Negative for fever.  Respiratory:  Negative for shortness of breath.   Cardiovascular:  Negative for chest pain.  Gastrointestinal:  Positive for abdominal pain. Negative for vomiting.  Genitourinary:  Negative for dysuria, hematuria and testicular pain.  Musculoskeletal:  Negative for back pain.  Skin:  Negative for rash.    Physical Exam Updated Vital Signs BP 133/88 (BP Location: Right Arm)   Pulse 89   Temp 99.1 F (37.3 C) (Oral)   Resp 16   Ht 1.676 m (5\' 6" )   Wt 79.8 kg   SpO2 92%   BMI 28.40 kg/m  Physical Exam Vitals and nursing note reviewed.  Constitutional:      Appearance: Normal appearance. He is well-developed.  HENT:     Head: Atraumatic.     Nose: Nose normal.     Mouth/Throat:     Mouth: Mucous membranes are moist.  Eyes:     General: No scleral icterus.    Conjunctiva/sclera: Conjunctivae normal.  Neck:     Trachea: No tracheal deviation.  Cardiovascular:     Rate and Rhythm: Normal rate.  Pulses: Normal pulses.  Pulmonary:     Effort: Pulmonary effort is normal. No accessory muscle usage or respiratory distress.  Abdominal:     General: Bowel sounds are normal. There is no distension.     Palpations: Abdomen is soft. There is no mass.     Tenderness: There is abdominal tenderness. There is no guarding or rebound.     Hernia: No hernia is present.     Comments: Left lower abd/pelvis tenderness.   Genitourinary:    Comments: No cva tenderness. Normal external gu exam. No testicle/scrotal pain or swelling.  Musculoskeletal:        General: No swelling.     Cervical back: Neck supple.  Skin:    General: Skin is warm and dry.     Findings: No rash.  Neurological:     Mental Status: He is alert.     Comments: Alert, speech clear.   Psychiatric:         Mood and Affect: Mood normal.     ED Results / Procedures / Treatments   Labs (all labs ordered are listed, but only abnormal results are displayed) Results for orders placed or performed during the hospital encounter of 06/05/23  Urinalysis, Routine w reflex microscopic -Urine, Clean Catch  Result Value Ref Range   Color, Urine YELLOW YELLOW   APPearance CLEAR CLEAR   Specific Gravity, Urine 1.024 1.005 - 1.030   pH 7.0 5.0 - 8.0   Glucose, UA NEGATIVE NEGATIVE mg/dL   Hgb urine dipstick NEGATIVE NEGATIVE   Bilirubin Urine NEGATIVE NEGATIVE   Ketones, ur NEGATIVE NEGATIVE mg/dL   Protein, ur TRACE (A) NEGATIVE mg/dL   Nitrite NEGATIVE NEGATIVE   Leukocytes,Ua NEGATIVE NEGATIVE   CT Renal Stone Study  Result Date: 06/05/2023 CLINICAL DATA:  Lower abdominal pain for 3 days. EXAM: CT ABDOMEN AND PELVIS WITHOUT CONTRAST TECHNIQUE: Multidetector CT imaging of the abdomen and pelvis was performed following the standard protocol without IV contrast. RADIATION DOSE REDUCTION: This exam was performed according to the departmental dose-optimization program which includes automated exposure control, adjustment of the mA and/or kV according to patient size and/or use of iterative reconstruction technique. COMPARISON:  CT angiogram 11/14/2021.  Noncontrast CT February 2018 FINDINGS: Lower chest: There is some linear opacity lung bases likely scar or atelectasis. No pleural effusion. Coronary artery calcifications are seen. Small hiatal hernia. Prominent fat extrapleural posterior left lung base. Hepatobiliary: Fatty liver infiltration. Contracted gallbladder with stones. Pancreas: Unremarkable. No pancreatic ductal dilatation or surrounding inflammatory changes. Spleen: Normal in size without focal abnormality. Adrenals/Urinary Tract: Stable adrenal nodularity, nonspecific. No abnormal calcifications are seen within either kidney nor along the course of either ureter. There are numerous bilateral renal  cysts identified. Many of which have Hounsfield units of less than 15 and would be considered benign cystic lesions. No specific imaging follow-up. There are several which are simply too small to completely characterize on this noncontrast exam. Stomach/Bowel: On this non oral contrast exam the large bowel has a normal course and caliber with scattered stool and diverticula formation of the left side with normal appendix. There is wall thickening and stranding along the distal descending colon in the area of diverticula with wall thickening consistent with an area of diverticulitis. No complicating features of free air, fluid collection or obstruction. The stomach and small bowel are nondilated. Vascular/Lymphatic: Scattered vascular calcifications along the aorta and branch vessels. There is saccular aneurysm extending anterior from the infrarenal abdominal aorta measuring 2.6 cm.  Going back to the prior CTA this seen at 2.6 cm and the aneurysm component itself is predominantly thrombosed. Normal caliber IVC. No specific abnormal lymph node enlargement seen in the abdomen and pelvis. Reproductive: There is a large prostate with mass effect along the base of the bladder. Please correlate with patient's PSA and any evidence of BPH. Other: No free intra-air or free fluid. Small fat containing inguinal hernias. Musculoskeletal: Scattered degenerative changes of the spine and pelvis. Bridging osteophytes along the sacroiliac joints. Some levels of disc bulging and stenosis. IMPRESSION: Focal new inflammatory stranding and thickening along the distal descending colon near the junction to the sigmoid consistent with a area of simple diverticulitis. No complex features at this time. Recommend follow-up after treatment to exclude secondary pathology. Fatty liver infiltration.  Gallstones in the nondilated gallbladder. Small hiatal hernia. Stable 2.6 cm saccular infrarenal abdominal aortic aneurysm as seen on prior  examination. Enlarged prostate with mass effect along the base of the bladder. Please correlate for the patient's PSA and evidence of BPH. Electronically Signed   By: Karen Kays M.D.   On: 06/05/2023 12:01    EKG None  Radiology CT Renal Stone Study  Result Date: 06/05/2023 CLINICAL DATA:  Lower abdominal pain for 3 days. EXAM: CT ABDOMEN AND PELVIS WITHOUT CONTRAST TECHNIQUE: Multidetector CT imaging of the abdomen and pelvis was performed following the standard protocol without IV contrast. RADIATION DOSE REDUCTION: This exam was performed according to the departmental dose-optimization program which includes automated exposure control, adjustment of the mA and/or kV according to patient size and/or use of iterative reconstruction technique. COMPARISON:  CT angiogram 11/14/2021.  Noncontrast CT February 2018 FINDINGS: Lower chest: There is some linear opacity lung bases likely scar or atelectasis. No pleural effusion. Coronary artery calcifications are seen. Small hiatal hernia. Prominent fat extrapleural posterior left lung base. Hepatobiliary: Fatty liver infiltration. Contracted gallbladder with stones. Pancreas: Unremarkable. No pancreatic ductal dilatation or surrounding inflammatory changes. Spleen: Normal in size without focal abnormality. Adrenals/Urinary Tract: Stable adrenal nodularity, nonspecific. No abnormal calcifications are seen within either kidney nor along the course of either ureter. There are numerous bilateral renal cysts identified. Many of which have Hounsfield units of less than 15 and would be considered benign cystic lesions. No specific imaging follow-up. There are several which are simply too small to completely characterize on this noncontrast exam. Stomach/Bowel: On this non oral contrast exam the large bowel has a normal course and caliber with scattered stool and diverticula formation of the left side with normal appendix. There is wall thickening and stranding along the  distal descending colon in the area of diverticula with wall thickening consistent with an area of diverticulitis. No complicating features of free air, fluid collection or obstruction. The stomach and small bowel are nondilated. Vascular/Lymphatic: Scattered vascular calcifications along the aorta and branch vessels. There is saccular aneurysm extending anterior from the infrarenal abdominal aorta measuring 2.6 cm. Going back to the prior CTA this seen at 2.6 cm and the aneurysm component itself is predominantly thrombosed. Normal caliber IVC. No specific abnormal lymph node enlargement seen in the abdomen and pelvis. Reproductive: There is a large prostate with mass effect along the base of the bladder. Please correlate with patient's PSA and any evidence of BPH. Other: No free intra-air or free fluid. Small fat containing inguinal hernias. Musculoskeletal: Scattered degenerative changes of the spine and pelvis. Bridging osteophytes along the sacroiliac joints. Some levels of disc bulging and stenosis. IMPRESSION: Focal new inflammatory  stranding and thickening along the distal descending colon near the junction to the sigmoid consistent with a area of simple diverticulitis. No complex features at this time. Recommend follow-up after treatment to exclude secondary pathology. Fatty liver infiltration.  Gallstones in the nondilated gallbladder. Small hiatal hernia. Stable 2.6 cm saccular infrarenal abdominal aortic aneurysm as seen on prior examination. Enlarged prostate with mass effect along the base of the bladder. Please correlate for the patient's PSA and evidence of BPH. Electronically Signed   By: Karen Kays M.D.   On: 06/05/2023 12:01    Procedures Procedures    Medications Ordered in ED Medications  amoxicillin-clavulanate (AUGMENTIN) 875-125 MG per tablet 1 tablet (has no administration in time range)  acetaminophen (TYLENOL) tablet 1,000 mg (has no administration in time range)  ibuprofen  (ADVIL) tablet 400 mg (has no administration in time range)    ED Course/ Medical Decision Making/ A&P                                 Medical Decision Making Problems Addressed: Acute diverticulitis: acute illness or injury with systemic symptoms that poses a threat to life or bodily functions Enlarged prostate: chronic illness or injury Gallstones: chronic illness or injury Infrarenal abdominal aortic aneurysm, without rupture (HCC): chronic illness or injury  Amount and/or Complexity of Data Reviewed Independent Historian: spouse    Details: hx External Data Reviewed: notes. Labs: ordered. Decision-making details documented in ED Course. Radiology: ordered and independent interpretation performed. Decision-making details documented in ED Course.  Risk OTC drugs. Prescription drug management. Decision regarding hospitalization.   Labs ordered/sent. Imaging ordered.   Differential diagnosis includes ureteral stone, diverticulitis, groin/msk strain, hernia, etc. Dispo decision including potential need for admission considered - will get labs and imaging and reassess.   Reviewed nursing notes and prior charts for additional history. External reports reviewed. Additional history from: spouse.   Labs reviewed/interpreted by me - ua neg for uti or blood.   CT reviewed/interpreted by me - +diverticulitis. Multiple incidental findings noted, shared and discussed w pt - pt indicates had previously been made aware of same issues, will f/u pcp.   Augmentin po. Acetaminophen po. Ibuprofen po.   Pt appears stable for d/c.   Rec close pcp f/u.  Return precautions provided.          Final Clinical Impression(s) / ED Diagnoses Final diagnoses:  Acute diverticulitis  Gallstones  Enlarged prostate  Infrarenal abdominal aortic aneurysm, without rupture [facility])    Rx / DC Orders ED Discharge Orders     None         Cathren Laine, MD 06/05/23 1231

## 2023-06-05 NOTE — ED Notes (Signed)
Pt discharged to home using teachback Method. Discharge instructions have been discussed with patient and/or family members. Pt verbally acknowledges understanding d/c instructions, has been given opportunity for questions to be answered, and endorses comprehension to checkout at registration before leaving.

## 2023-06-05 NOTE — Discharge Instructions (Addendum)
It was our pleasure to provide your ER care today - we hope that you feel better.  Your ct scan was read as showing diverticulitis - see attached info. Take antibiotic as prescribed. Take acetaminophen or ibuprofen as need. Follow up with primary care doctor in the coming week if symptoms fail to improve/resolve.  Other incidental findings were noted on your ct scan including:  Focal new inflammatory stranding and thickening along the distal descending colon near the junction to the sigmoid consistent with a area of simple diverticulitis. No complex features at this time. Recommend follow-up after treatment to exclude secondary pathology. Fatty liver infiltration.  Gallstones in the nondilated gallbladder. Small hiatal hernia. Stable 2.6 cm saccular infrarenal abdominal aortic aneurysm as seen on prior examination. Enlarged prostate with mass effect along the base of the bladder, follow up with your doctor.   Discuss these findings/CT reading with your doctor at follow up with them, and have them arrange the appropriate follow up to those imaging findings.   Return to ER if worse, new symptoms, high fevers, new or worsening or severe abdominal pain, persistent vomiting, or other concern.

## 2023-06-05 NOTE — ED Triage Notes (Signed)
Patient arrive with complaints of lower abdominal pain on the left and right side x3 days. Report no urinary symptoms, but he does have a history of kidney stones (several years since the last stone).  Reports he is also recovering from a gout flare/elevated uric acid levels, per patient.

## 2023-06-11 ENCOUNTER — Encounter: Payer: Self-pay | Admitting: Cardiology

## 2023-06-11 ENCOUNTER — Ambulatory Visit: Payer: PPO | Attending: Cardiology | Admitting: Cardiology

## 2023-06-11 VITALS — BP 146/88 | HR 109 | Ht 66.0 in | Wt 174.4 lb

## 2023-06-11 DIAGNOSIS — I10 Essential (primary) hypertension: Secondary | ICD-10-CM | POA: Diagnosis not present

## 2023-06-11 DIAGNOSIS — I2584 Coronary atherosclerosis due to calcified coronary lesion: Secondary | ICD-10-CM

## 2023-06-11 DIAGNOSIS — G4733 Obstructive sleep apnea (adult) (pediatric): Secondary | ICD-10-CM

## 2023-06-11 DIAGNOSIS — I2510 Atherosclerotic heart disease of native coronary artery without angina pectoris: Secondary | ICD-10-CM

## 2023-06-11 DIAGNOSIS — I77819 Aortic ectasia, unspecified site: Secondary | ICD-10-CM

## 2023-06-11 MED ORDER — NEXLIZET 180-10 MG PO TABS: 1.0000 | ORAL_TABLET | Freq: Every day | ORAL | 3 refills | Status: DC

## 2023-06-11 MED ORDER — AMLODIPINE BESYLATE 5 MG PO TABS: 5.0000 mg | ORAL_TABLET | Freq: Every day | ORAL | 3 refills | Status: DC

## 2023-06-11 MED ORDER — METOPROLOL SUCCINATE ER 50 MG PO TB24: 75.0000 mg | ORAL_TABLET | Freq: Every day | ORAL | 3 refills | Status: DC

## 2023-06-11 MED ORDER — CHLORTHALIDONE 25 MG PO TABS: 25.0000 mg | ORAL_TABLET | Freq: Every day | ORAL | 3 refills | Status: DC

## 2023-06-11 NOTE — Progress Notes (Signed)
Cardiology Office Note:  .   Date:  06/11/2023  ID:  Daaiel Beardmore, DOB [birth year], MRN [id number] PCP: Gaspar Garbe, MD  [facility] Providers Cardiologist:  Donato Schultz, MD    History of Present Illness: .   Mali Juaire is a 72 y.o. male Discussed with the use of AI scribe software   History of Present Illness   The patient, a 72 year old with a history of calcific aorta, small hiatal hernia, mural thrombus, infrarenal aorta dilatation , and a family history of premature heart disease, presents for a routine follow-up. He has been managing his conditions with Nexlizet 180-10 mg (statin intolerant), aspirin 81 mg, Toprol XL 50 mg, Norvasc 5 mg, and chlorthalidone 25 mg. He has also been treated for mild sleep apnea by Dr. Mayford Knife.  Recently, the patient experienced an episode of gout and diverticulitis, requiring medical attention. He reports that his medications have been well-regulated and he has been tolerating Nexlizet well since discontinuing statins due to intolerance.  The patient has been struggling to adjust to a CPAP machine for his sleep apnea and has recently switched to a mouthpiece, which has improved his sleep quality over the past two weeks. He denies any chest pain or unusual shortness of breath, only experiencing breathlessness with overexertion.  The patient also reports a tendency to bruise easily, even with minor bumps, which he attributes to his aspirin therapy. Despite this, he has been maintaining an active lifestyle, including yard work. He has been using protective sleeves to prevent skin injuries while working in the yard.  His blood pressure has been under better control, and his LDL was 71 at the last check. He has coronary artery calcifications and is on aspirin for this. He has a stable 2.6 cm infrarenal abdominal aortic dilatation and scattered calcifications in the aorta. He is also on Nexlizet for cholesterol management.      - Family history of  premature heart diseas   Studies Reviewed: Marland Kitchen        LABS LDL: 71 Hemoglobin: 16.8 Uric Acid: 14  RADIOLOGY CT scan: Stable 2.6 cm infrarenal abdominal aortic dilatation, scattered calcifications in the aorta (06/05/2023)  DIAGNOSTIC Echocardiogram: EF 60-65%, Grade 1 diastolic dysfunction, trivial mitral regurgitation (11/27/2022) Risk Assessment/Calculations:       STOP-Bang Score:  7      Physical Exam:   VS:  BP (!) 146/88   Pulse (!) 109   Ht 5\' 6"  (1.676 m)   Wt 174 lb 6.4 oz (79.1 kg)   SpO2 98%   BMI 28.15 kg/m    Wt Readings from Last 3 Encounters:  06/11/23 174 lb 6.4 oz (79.1 kg)  06/05/23 175 lb 14.8 oz (79.8 kg)  12/10/22 176 lb (79.8 kg)    GEN: Well nourished, well developed in no acute distress NECK: No JVD; No carotid bruits CARDIAC: RRR, no murmurs, rubs, gallops RESPIRATORY:  Clear to auscultation without rales, wheezing or rhonchi  ABDOMEN: Soft, non-tender, non-distended EXTREMITIES:  No edema; No deformity   ASSESSMENT AND PLAN: .   Assessment and Plan    Coronary Artery Disease/Hyperlipidemia Stable with coronary artery calcifications. LDL 71 on Nexlizet 180-10 mg and Aspirin 81 mg. No chest pain or unusual shortness of breath. -Continue current medications. Statin intolerant.  -Annual follow-up.  Hypertension Well controlled on Toprol XL 75 mg, Norvasc 5 mg, and Chlorthalidone 25 mg. -Continue current medications.  Mild Sleep Apnea Difficulty adjusting to CPAP machine. Recently started using a mouthpiece and reports  improved sleep. -Continue management with Dr. Mayford Knife.  Infrarenal Abdominal Aortic Aneurysm Stable at 2.6 cm. Scan reviewed.  -Continue current management and monitoring.  Gout and Diverticulitis Recent episodes managed by primary care physician. -Continue current management with primary care physician.  Nuisance Bleeding Reports easy bruising and bleeding with minor trauma, likely due to Aspirin 81 mg. -Continue  Aspirin 81 mg. Reassured patient about the benign nature of the bleeding.            Dispo: 1 yr  Signed, Donato Schultz, MD

## 2023-06-11 NOTE — Patient Instructions (Signed)
Medication Instructions:  Your physician recommends that you continue on your current medications as directed. Please refer to the Current Medication list given to you today.  *If you need a refill on your cardiac medications before your next appointment, please call your pharmacy*   Lab Work: NONE If you have labs (blood work) drawn today and your tests are completely normal, you will receive your results only by: MyChart Message (if you have MyChart) OR A paper copy in the mail If you have any lab test that is abnormal or we need to change your treatment, we will call you to review the results.   Testing/Procedures: NONE   Follow-Up: At [facility], you and your health needs are our priority.  As part of our continuing mission to provide you with exceptional heart care, we have created designated Provider Care Teams.  These Care Teams include your primary Cardiologist (physician) and Advanced Practice Providers (APPs -  Physician Assistants and Nurse Practitioners) who all work together to provide you with the care you need, when you need it.  Your next appointment:   1 year(s)  Provider:   Donato Schultz, MD

## 2023-06-18 DIAGNOSIS — H524 Presbyopia: Secondary | ICD-10-CM | POA: Diagnosis not present

## 2023-06-18 DIAGNOSIS — H40013 Open angle with borderline findings, low risk, bilateral: Secondary | ICD-10-CM | POA: Diagnosis not present

## 2023-06-24 ENCOUNTER — Encounter: Payer: Self-pay | Admitting: Pharmacist

## 2023-06-25 DIAGNOSIS — M109 Gout, unspecified: Secondary | ICD-10-CM | POA: Diagnosis not present

## 2023-06-25 DIAGNOSIS — M79674 Pain in right toe(s): Secondary | ICD-10-CM | POA: Diagnosis not present

## 2023-06-26 DIAGNOSIS — G4733 Obstructive sleep apnea (adult) (pediatric): Secondary | ICD-10-CM | POA: Diagnosis not present

## 2023-06-27 ENCOUNTER — Telehealth: Payer: Self-pay

## 2023-06-27 NOTE — Telephone Encounter (Signed)
Per Dr. Mayford Knife, patient needs new HST while using oral appliance. Patient was called by staff to set up home sleep study, he states he will be getting it through another sleep medicine practice.

## 2023-06-27 NOTE — Telephone Encounter (Signed)
-----   Message from Armanda Magic sent at 06/26/2023  2:06 PM EDT ----- Please order an Itamar HST - make sure patient understands that he needs to wear his oral appliance the night he does the study ----- Message ----- From: Royann Shivers Sent: 06/26/2023   1:55 PM EDT To: Quintella Reichert, MD; Luellen Pucker, RN  A copy of this note was also given to Coralee North to possibly schedule a f/u with Mayford Knife

## 2023-07-03 ENCOUNTER — Telehealth: Payer: Self-pay

## 2023-07-03 NOTE — Telephone Encounter (Signed)
-----   Message from Armanda Magic sent at 06/26/2023  2:06 PM EDT ----- Please order an Itamar HST - make sure patient understands that he needs to wear his oral appliance the night he does the study ----- Message ----- From: Royann Shivers Sent: 06/26/2023   1:55 PM EDT To: Quintella Reichert, MD; Luellen Pucker, RN  A copy of this note was also given to Coralee North to possibly schedule a f/u with Mayford Knife

## 2023-07-03 NOTE — Telephone Encounter (Signed)
Call to patient to ask if he has completed sleep study with his other sleep medicine doctor. Patient states he has not and he was confused about whether Dr. Mayford Knife or his "other sleep doctor" should order his sleep study. Patient states he has been seeing Christopher Fabry, PA-C at [facility] and was told Mr. Christopher Castillo can order sleep study or Dr. Mayford Knife can. Patient has decided to pursue sleep study with Mr. Christopher Castillo while using oral appliance. Patient states he will forward a copy of sleep study to Dr. Mayford Knife.

## 2023-07-07 ENCOUNTER — Ambulatory Visit: Payer: PPO | Admitting: Cardiology

## 2023-07-08 NOTE — Telephone Encounter (Signed)
Christopher Castillo study was faxed to medical records 05/17/23.

## 2023-07-09 ENCOUNTER — Telehealth: Payer: Self-pay

## 2023-07-09 NOTE — Telephone Encounter (Signed)
Transition Care Management Unsuccessful Follow-up Telephone Call  Date of discharge and from where:  [facility] 9/19  Attempts:  1st Attempt  Reason for unsuccessful TCM follow-up call:  No answer/busy   Christopher Castillo [place]  [facility], [facility] Guide, Phone: [phone] Website: [email or web address]

## 2023-07-10 ENCOUNTER — Telehealth: Payer: Self-pay

## 2023-07-10 NOTE — Telephone Encounter (Signed)
Transition Care Management Unsuccessful Follow-up Telephone Call  Date of discharge and from where:  [facility] 9/19  Attempts:  2nd Attempt  Reason for unsuccessful TCM follow-up call:  No answer/busy   [name]  [facility], [facility] Guide, Phone: [phone] Website: [email or web address]

## 2023-08-21 IMAGING — US US ABDOMEN LIMITED
1 series · 14 of 25 positions shown · non-contrast
Comparison: None.

CLINICAL DATA: Epigastric pain.

EXAM:
ULTRASOUND ABDOMEN LIMITED RIGHT UPPER QUADRANT

[Series 1: us abdomen limited ruq (liver/gb) · 14 of 77 slices shown]
[im 1/77]
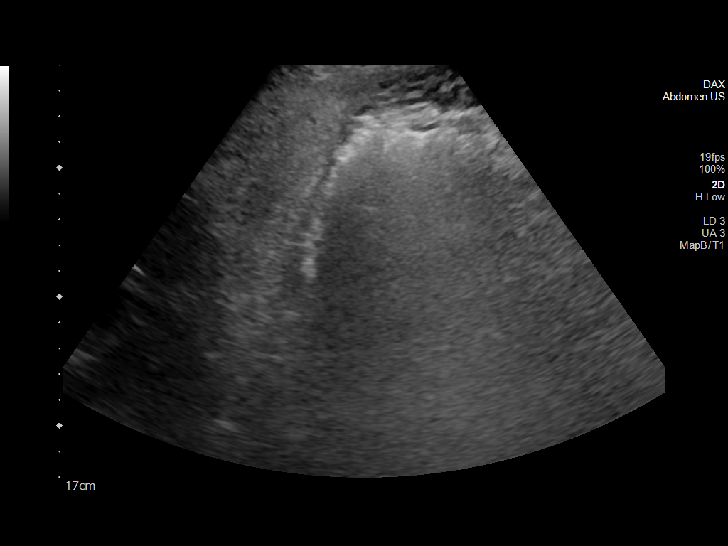
[im 7/77]
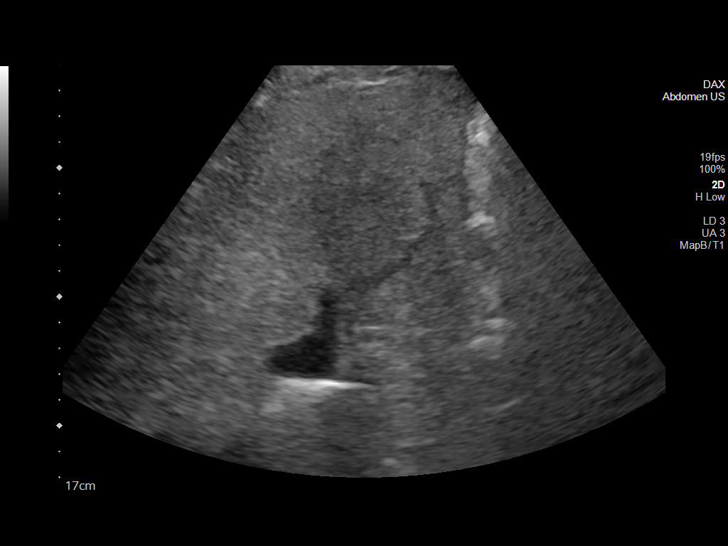
[im 13/77]
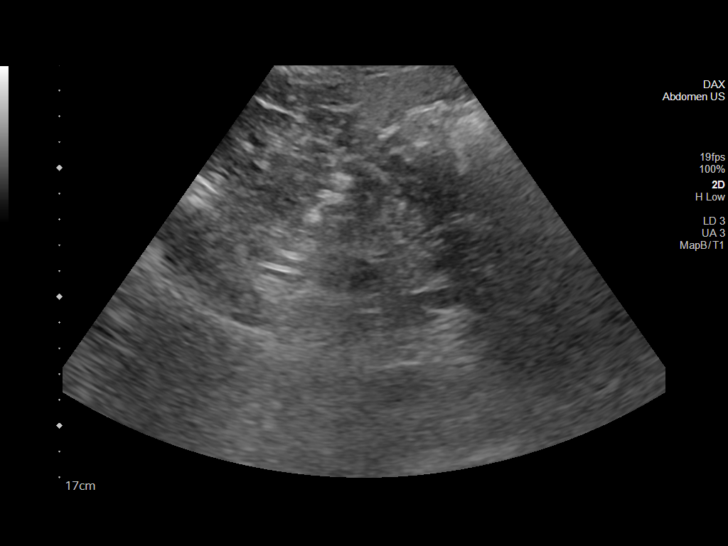
[im 20/77]
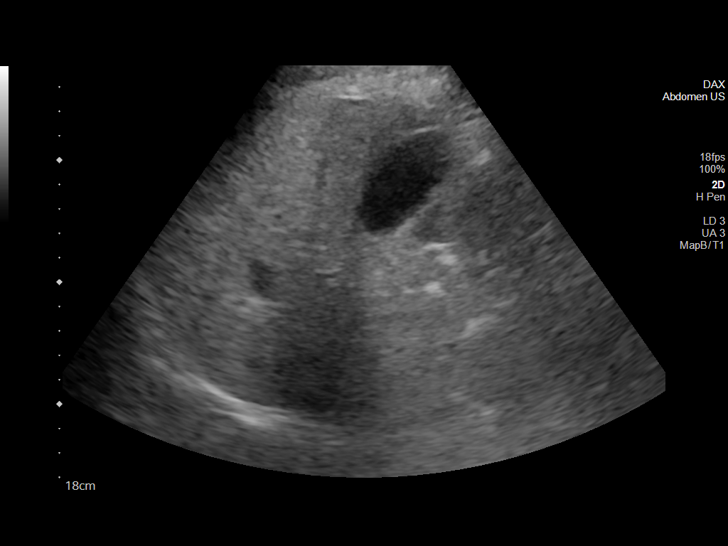
[im 26/77]
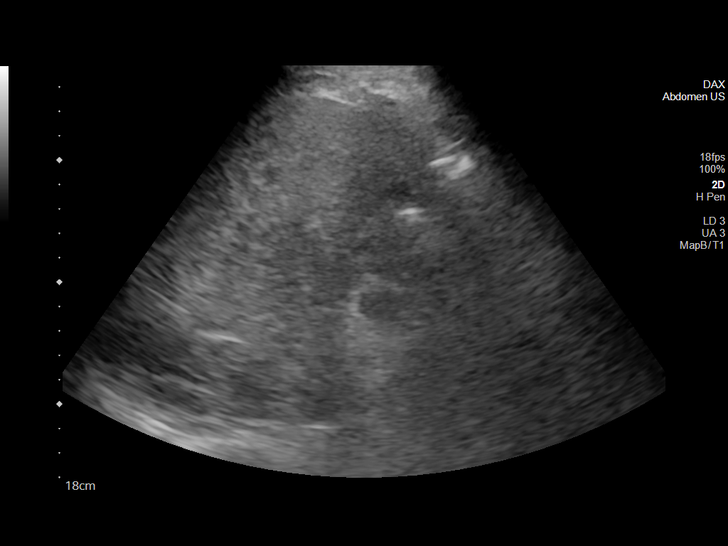
[im 29/77]
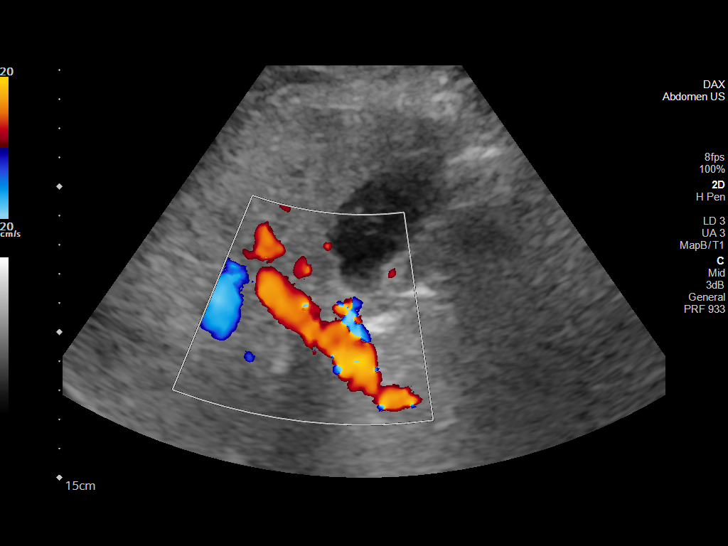
[im 35/77]
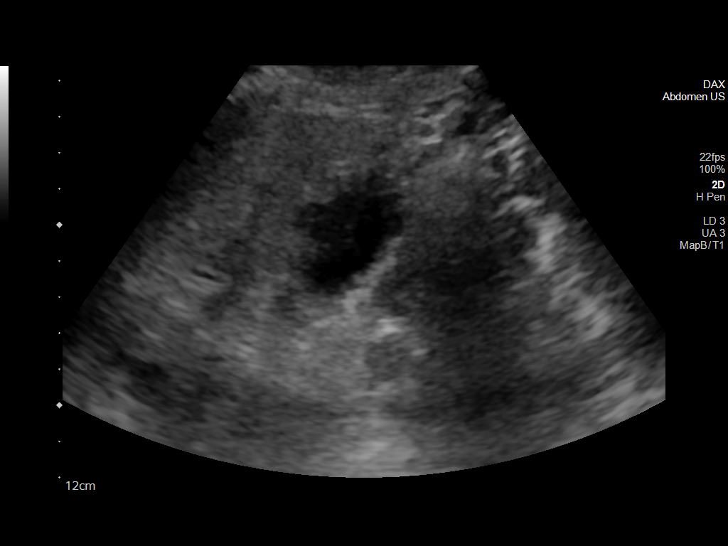
[im 42/77]
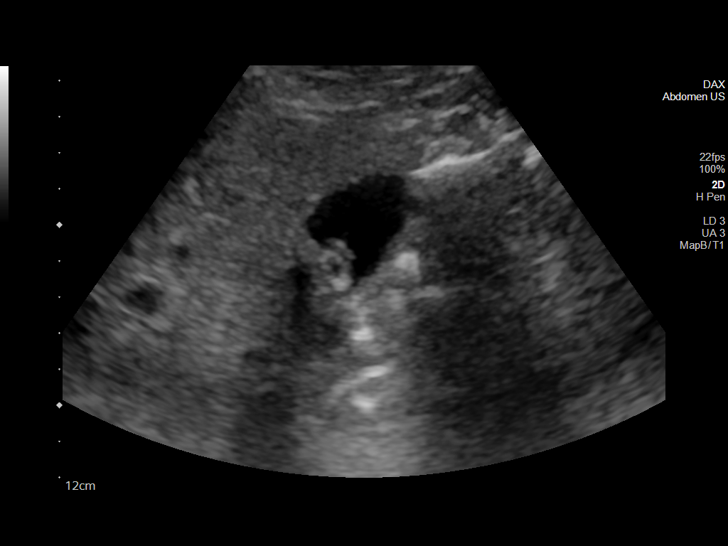
[im 48/77]
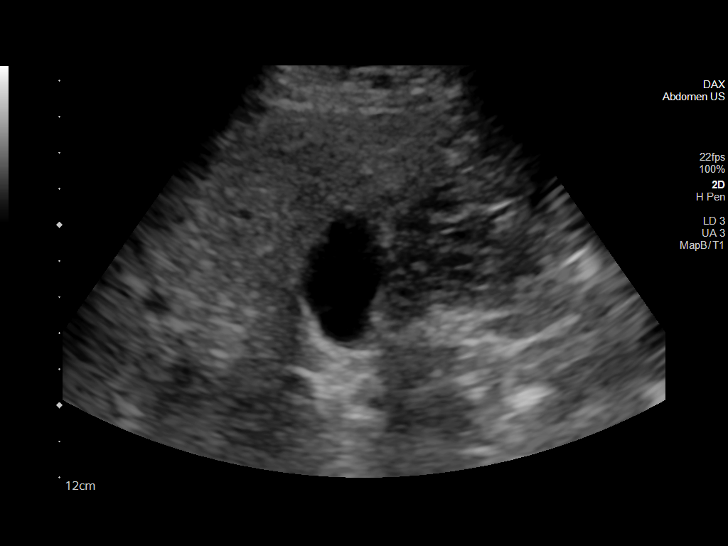
[im 51/77]
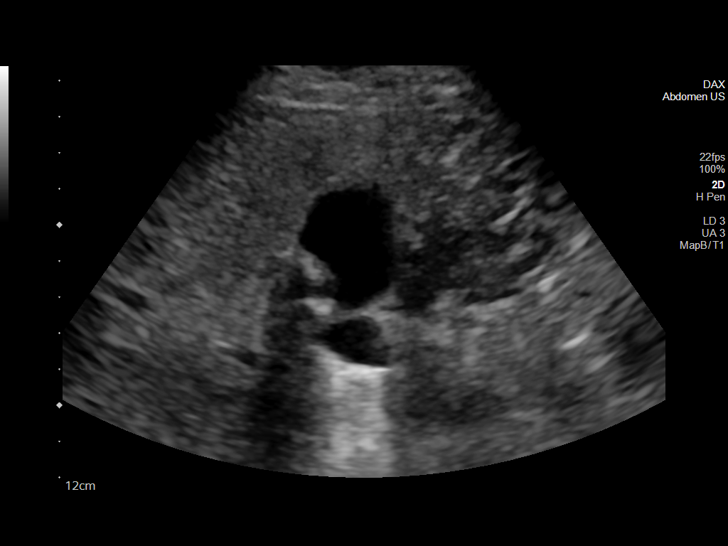
[im 58/77]
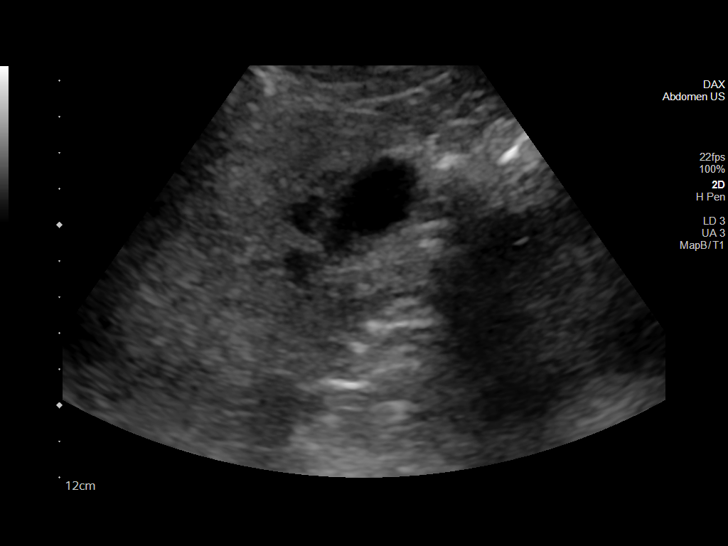
[im 64/77]
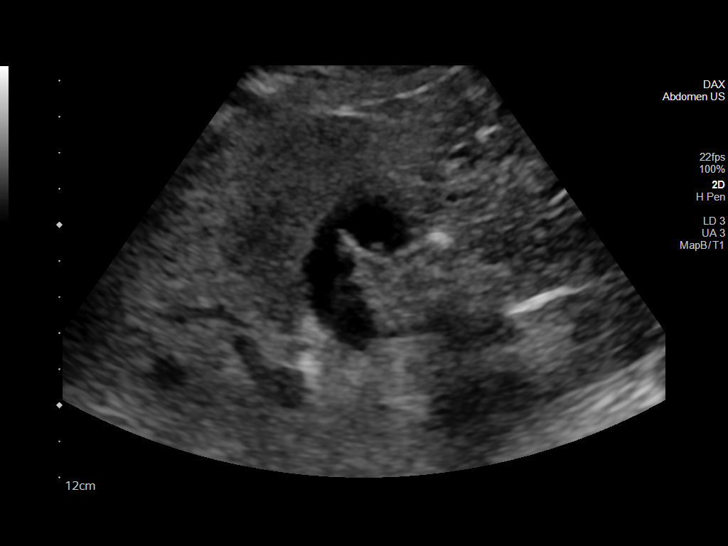
[im 70/77]
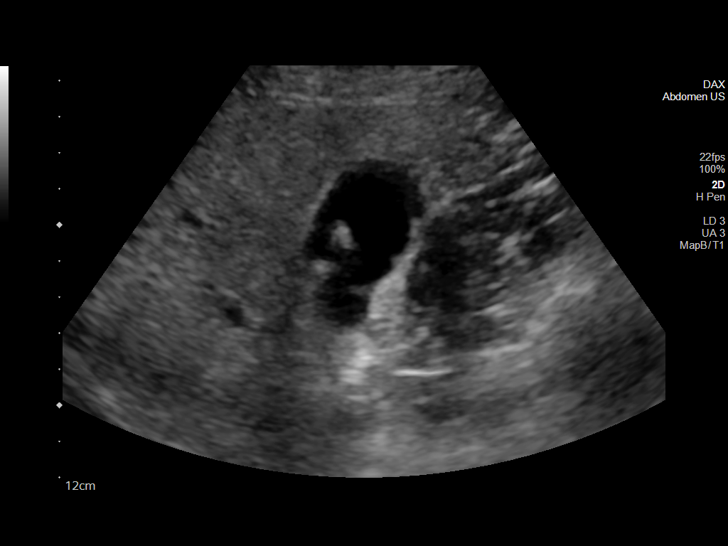
[im 77/77]
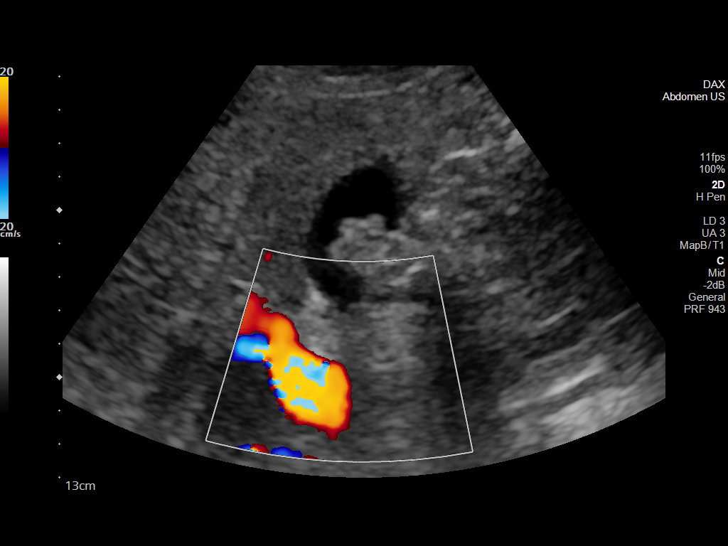

[14 of 25 positions shown; findings below may reference images not displayed]

FINDINGS: Gallbladder:

There is gallstone. No gallbladder wall thickening or
pericholecystic fluid. Negative sonographic Murphy's sign.

Common bile duct:

Diameter: 5 mm

Liver:

There is diffuse increased liver echogenicity most commonly seen in
the setting of fatty infiltration. Superimposed inflammation or
fibrosis is not excluded. Clinical correlation is recommended.
Portal vein is patent on color Doppler imaging with normal direction
of blood flow towards the liver.

Other: None.
IMPRESSION: 1. Cholelithiasis without sonographic evidence of acute
cholecystitis.
2. Fatty liver.

## 2023-08-22 IMAGING — CT CT ANGIO CHEST-ABD-PELV FOR DISSECTION W/ AND WO/W CM
2 of 7 series · 14 of 46 positions shown, 16 images · non-contrast
Comparison: 11/12/2016

CLINICAL DATA: Epigastric abdominal pain. Assess for dissection,
AAA

EXAM:
CT ANGIOGRAPHY CHEST, ABDOMEN AND PELVIS
TECHNIQUE: Non-contrast CT of the chest was initially obtained.

[Series 6: axial arterial · axial · arterial · 0.69mm/px · z∈[-507,+45]mm · 11 of 312 slices shown, 13 images]
[im 18/312  soft-tissue]
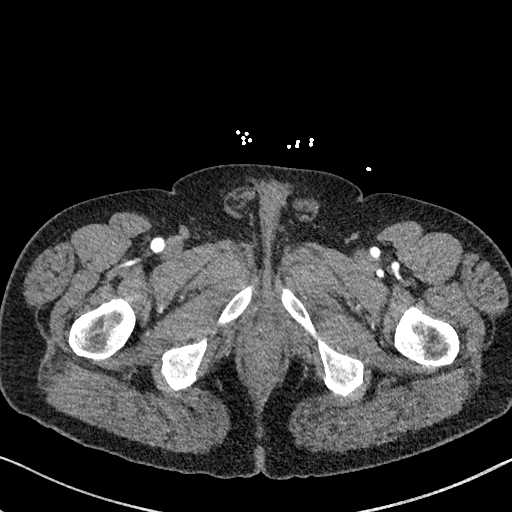
[im 18/312  bone]
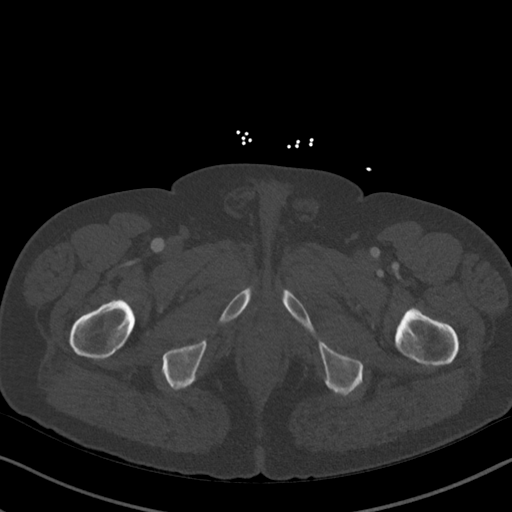
[im 52/312  soft-tissue]
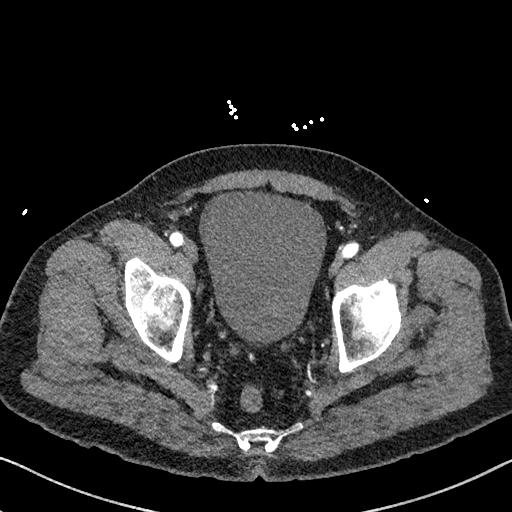
[im 70/312  soft-tissue]
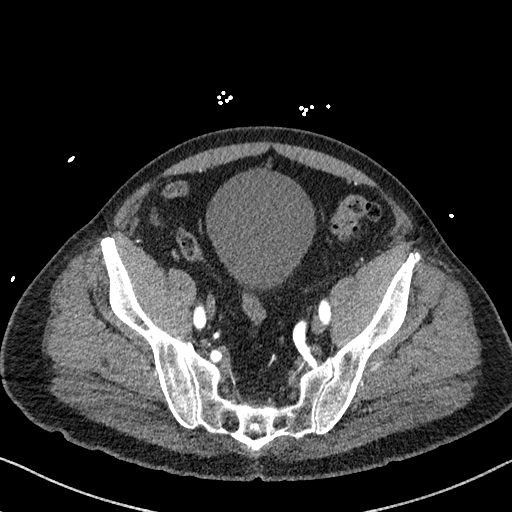
[im 104/312  soft-tissue]
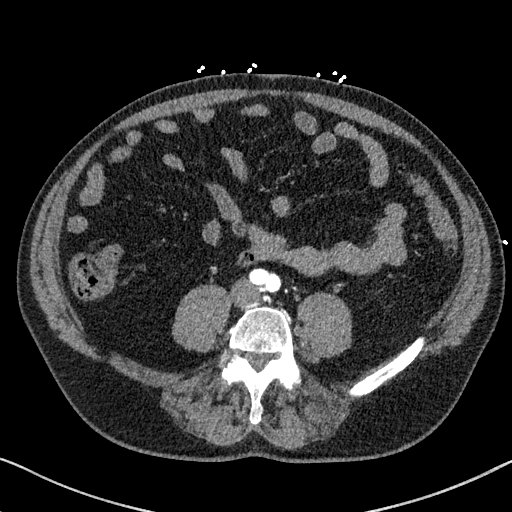
[im 121/312  soft-tissue]
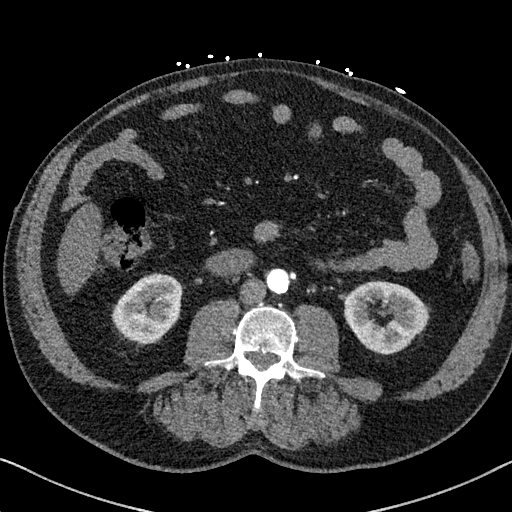
[im 156/312  soft-tissue]
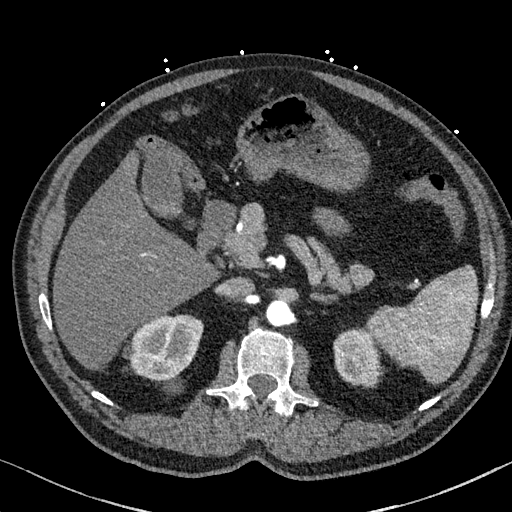
[im 191/312  soft-tissue]
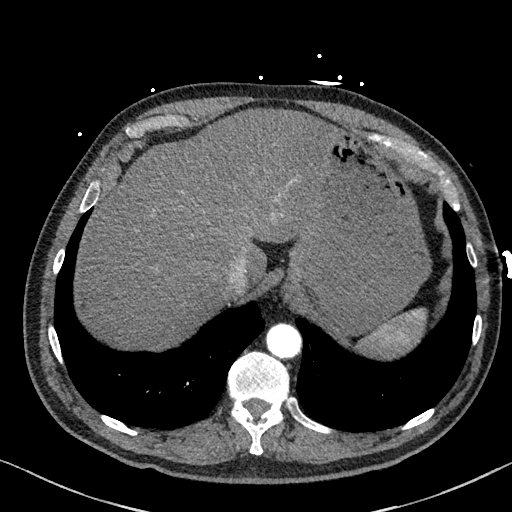
[im 208/312  soft-tissue]
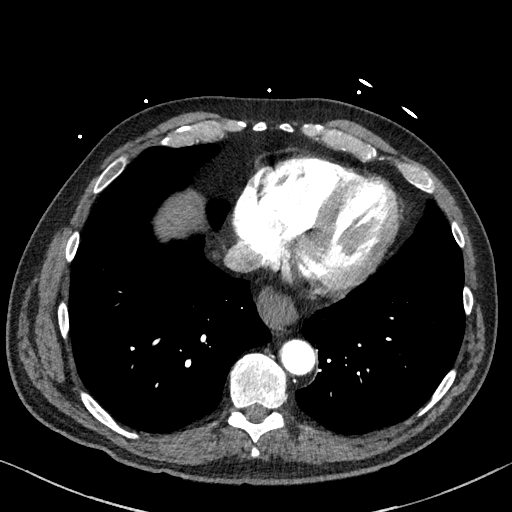
[im 242/312  soft-tissue]
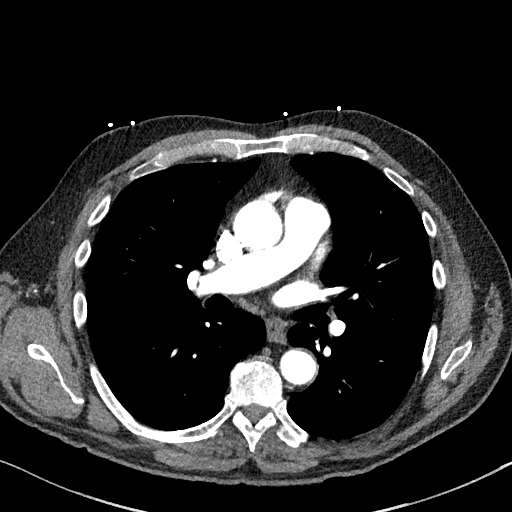
[im 242/312  bone]
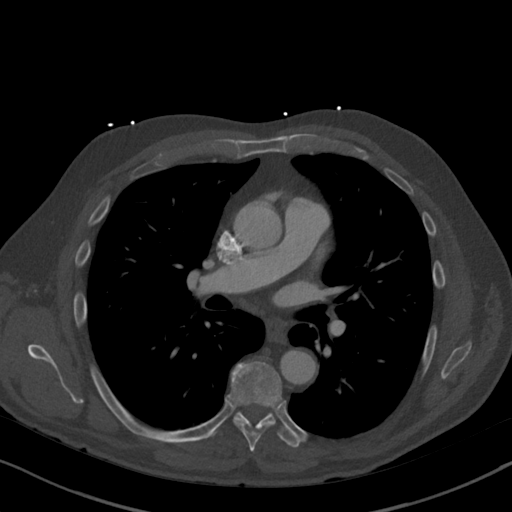
[im 260/312  soft-tissue]
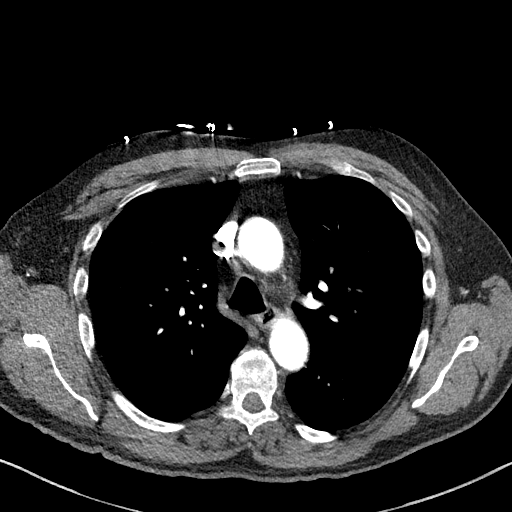
[im 294/312  soft-tissue]
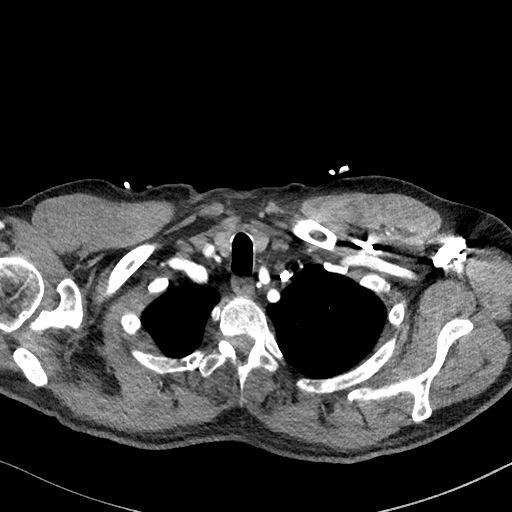

[Series 9: cor soft · coronal · 0.73mm/px · 3 of 154 slices shown]
[im 39/154  soft-tissue]
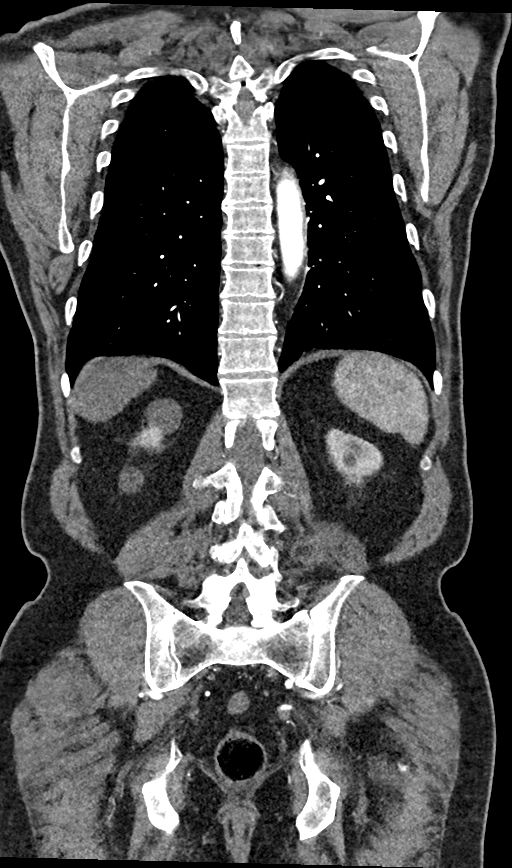
[im 77/154  soft-tissue]
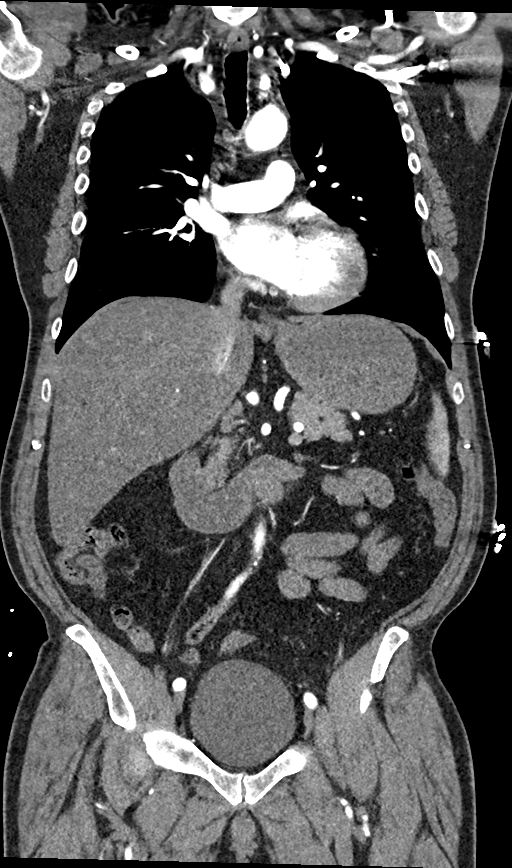
[im 115/154  soft-tissue]
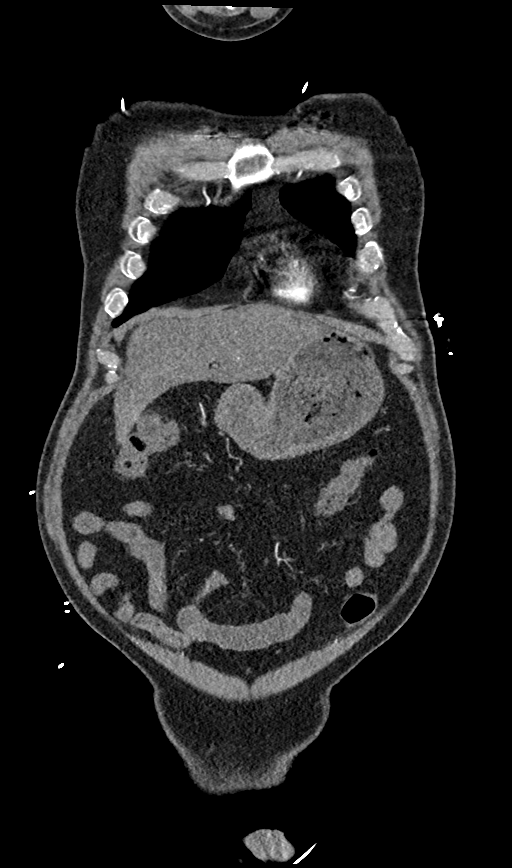

[14 of 46 positions shown; findings below may reference images not displayed]

Multidetector CT imaging through the chest, abdomen and pelvis was
performed using the standard protocol during bolus administration of
intravenous contrast. Multiplanar reconstructed images and MIPs were
obtained and reviewed to evaluate the vascular anatomy.

RADIATION DOSE REDUCTION: This exam was performed according to the
departmental dose-optimization program which includes automated
exposure control, adjustment of the mA and/or kV according to
patient size and/or use of iterative reconstruction technique.

CONTRAST:  100mL OMNIPAQUE IOHEXOL 350 MG/ML SOLN
FINDINGS: CTA CHEST FINDINGS

Cardiovascular: Heart is normal size. Scattered coronary artery
calcifications. No aneurysm or dissection. No filling defects in the
pulmonary arteries to suggest pulmonary emboli.

Mediastinum/Nodes: No mediastinal, hilar, or axillary adenopathy.
Trachea and esophagus are unremarkable. Thyroid unremarkable. Small
hiatal hernia.

Lungs/Pleura: Lungs are clear. No focal airspace opacities or
suspicious nodules. No effusions.

Musculoskeletal: Chest wall soft tissues are unremarkable. No acute
bony abnormality.

Review of the MIP images confirms the above findings.

CTA ABDOMEN AND PELVIS FINDINGS

VASCULAR

Aorta: Scattered aortic calcifications. Focal ectasia measuring
cm in the infrarenal aorta with mural thrombus. No dissection.

Celiac: Patent without evidence of aneurysm, dissection, vasculitis
or significant stenosis.

SMA: Patent without evidence of aneurysm, dissection, vasculitis or
significant stenosis.

Renals: Both renal arteries are patent without evidence of aneurysm,
dissection, vasculitis, fibromuscular dysplasia or significant
stenosis.

IMA: Patent without evidence of aneurysm, dissection, vasculitis or
significant stenosis.

Inflow: Patent without evidence of aneurysm, dissection, vasculitis
or significant stenosis.

Veins: No obvious venous abnormality within the limitations of this
arterial phase study.

Review of the MIP images confirms the above findings.

NON-VASCULAR

Hepatobiliary: Diffuse low-density throughout the liver compatible
with fatty infiltration. No focal abnormality. Small gallstones
within the gallbladder.

Pancreas: No focal abnormality or ductal dilatation.

Spleen: No focal abnormality.  Normal size.

Adrenals/Urinary Tract: 3.4 cm exophytic cyst off the upper pole of
the right kidney. Other smaller scattered right renal cysts. No
hydronephrosis. Adrenal glands and urinary bladder unremarkable.

Stomach/Bowel: Normal appendix. Stomach, large and small bowel
grossly unremarkable.

Lymphatic: No adenopathy

Reproductive: Prostate enlargement

Other: No free fluid or free air.

Musculoskeletal: No acute bony abnormality.

Review of the MIP images confirms the above findings.
IMPRESSION: No evidence of aortic dissection.

Mild focal dilatation of the infrarenal abdominal aorta measuring up
to 2.7 cm with mural thrombus.

Small hiatal hernia.

Scattered coronary artery and aortic calcifications.

Hepatic steatosis.

Cholelithiasis.

Prostate enlargement.

## 2023-08-22 IMAGING — DX DG CHEST 1V PORT
1 series · 1 of 1 positions shown · non-contrast
Comparison: CTA chest from same day.

CLINICAL DATA: Shortness of breath.

EXAM:
PORTABLE CHEST 1 VIEW

[chest ap]
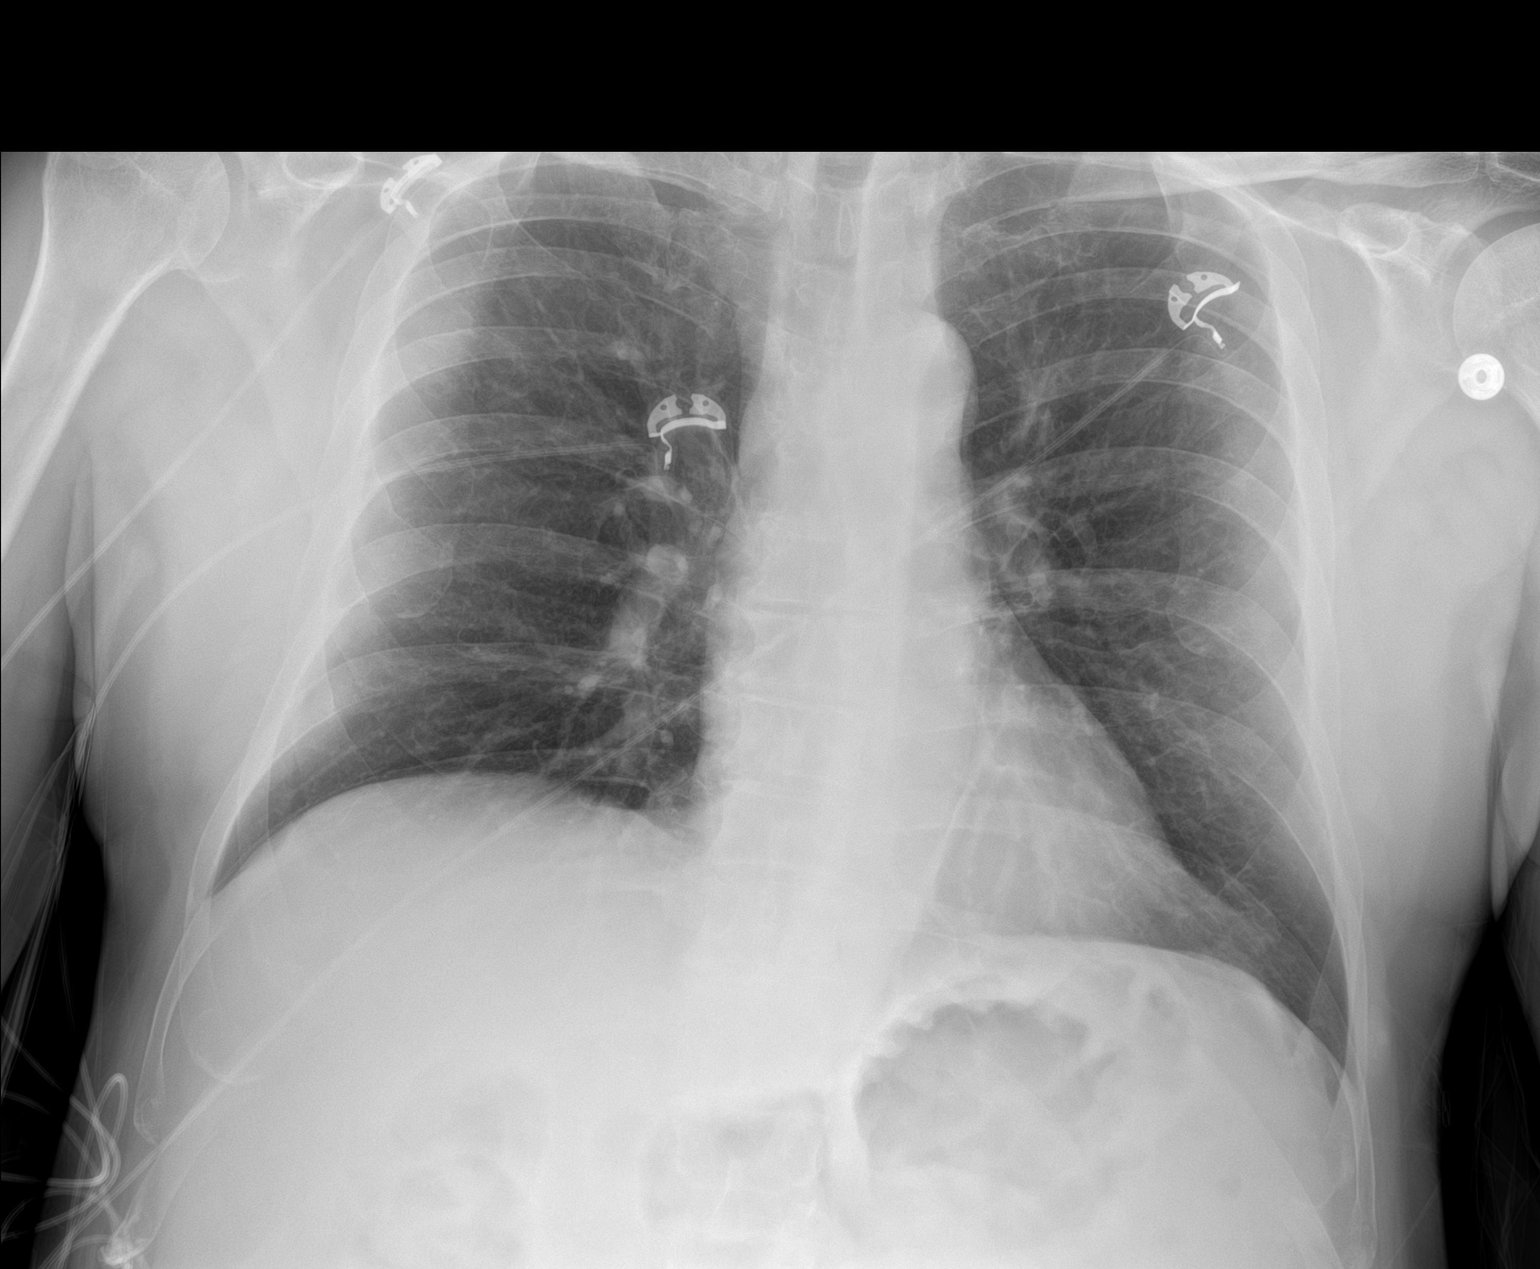

[1 of 1 positions shown; findings below may reference images not displayed]

FINDINGS: The heart size and mediastinal contours are within normal limits.
Both lungs are clear. The visualized skeletal structures are
unremarkable.
IMPRESSION: No active disease.

## 2023-08-25 DIAGNOSIS — L570 Actinic keratosis: Secondary | ICD-10-CM | POA: Diagnosis not present

## 2023-08-25 DIAGNOSIS — Z86007 Personal history of in-situ neoplasm of skin: Secondary | ICD-10-CM | POA: Diagnosis not present

## 2023-08-25 DIAGNOSIS — Z08 Encounter for follow-up examination after completed treatment for malignant neoplasm: Secondary | ICD-10-CM | POA: Diagnosis not present

## 2023-08-25 DIAGNOSIS — Z85828 Personal history of other malignant neoplasm of skin: Secondary | ICD-10-CM | POA: Diagnosis not present

## 2023-08-25 DIAGNOSIS — L814 Other melanin hyperpigmentation: Secondary | ICD-10-CM | POA: Diagnosis not present

## 2023-08-25 DIAGNOSIS — D225 Melanocytic nevi of trunk: Secondary | ICD-10-CM | POA: Diagnosis not present

## 2023-08-25 DIAGNOSIS — L821 Other seborrheic keratosis: Secondary | ICD-10-CM | POA: Diagnosis not present

## 2023-09-24 ENCOUNTER — Encounter: Payer: Self-pay | Admitting: Cardiology

## 2023-09-24 ENCOUNTER — Other Ambulatory Visit (HOSPITAL_COMMUNITY): Payer: Self-pay

## 2023-09-26 ENCOUNTER — Telehealth: Payer: Self-pay

## 2023-09-26 NOTE — Telephone Encounter (Signed)
 Patient Advocate Encounter   The patient was approved for a [employer] grant that will help cover the cost of NEXLIZET  Total amount awarded, $2500  Effective: 08/27/23 - 08/25/24   APW:[id number] ERW:[id number] Hmnle:[id number] PI:[id number]   Pharmacy provided with approval and processing information. Patient informed via RHONA Christopher Castillo, CPhT  Pharmacy Patient Advocate Specialist  Direct Number: [phone] Fax: [phone]

## 2023-09-26 NOTE — Telephone Encounter (Signed)
 Patient reenrolled in [employer]. Grant info and billing instructions have been faxed to pharmacy. Pt made aware via mychart of grant approval (see separate encounter)

## 2023-12-31 ENCOUNTER — Other Ambulatory Visit (HOSPITAL_COMMUNITY): Payer: Self-pay

## 2023-12-31 ENCOUNTER — Telehealth: Payer: Self-pay | Admitting: Pharmacy Technician

## 2023-12-31 NOTE — Telephone Encounter (Signed)
 Pharmacy Patient Advocate Encounter   Received notification from Fax that prior authorization for nexlizet is required/requested.   Insurance verification completed.   The patient is insured through [facility] ADVANTAGE/RX ADVANCE .   Per test claim: PA required; PA submitted to above mentioned insurance via CoverMyMeds Key/confirmation #/EOC [id number] Status is pending

## 2024-01-02 NOTE — Telephone Encounter (Signed)
 Pharmacy Patient Advocate Encounter  Received notification from [employer] that Prior Authorization for nexlizet  has been APPROVED from 01/01/24 to 12/31/24. Spoke to pharmacy to process.Copay is $461.25.  But I told them he has a healthwell grant and they will process it on that    PA #/Case ID/Reference #: [id number]

## 2024-04-29 ENCOUNTER — Telehealth: Payer: Self-pay | Admitting: Cardiology

## 2024-04-29 MED ORDER — AMLODIPINE BESYLATE 5 MG PO TABS: 5.0000 mg | ORAL_TABLET | Freq: Every day | ORAL | 0 refills | Status: DC

## 2024-04-29 NOTE — Telephone Encounter (Signed)
 *  STAT* If patient is at the pharmacy, call can be transferred to refill team.   1. Which medications need to be refilled? (please list name of each medication and dose if known)   amLODipine  (NORVASC ) 5 MG tablet   2. Which pharmacy/location (including street and city if local pharmacy) is medication to be sent to?  [facility] [place]  - [place], [place] - [address] [address] [facility] [place]   3. Do they need a 30 day or 90 day supply? 90 day   Patient has appointment on 07/21/24 with [facility]

## 2024-04-29 NOTE — Telephone Encounter (Signed)
 RX sent in

## 2024-07-09 ENCOUNTER — Other Ambulatory Visit: Payer: Self-pay | Admitting: Cardiology

## 2024-07-21 ENCOUNTER — Other Ambulatory Visit (HOSPITAL_COMMUNITY): Payer: Self-pay

## 2024-07-21 ENCOUNTER — Encounter: Payer: Self-pay | Admitting: Cardiology

## 2024-07-21 ENCOUNTER — Other Ambulatory Visit: Payer: Self-pay

## 2024-07-21 ENCOUNTER — Ambulatory Visit: Attending: Cardiology | Admitting: Cardiology

## 2024-07-21 VITALS — BP 152/89 | HR 97 | Ht 65.0 in | Wt 174.4 lb

## 2024-07-21 DIAGNOSIS — G4733 Obstructive sleep apnea (adult) (pediatric): Secondary | ICD-10-CM | POA: Diagnosis not present

## 2024-07-21 DIAGNOSIS — I10 Essential (primary) hypertension: Secondary | ICD-10-CM | POA: Diagnosis not present

## 2024-07-21 DIAGNOSIS — E782 Mixed hyperlipidemia: Secondary | ICD-10-CM | POA: Diagnosis not present

## 2024-07-21 DIAGNOSIS — I2510 Atherosclerotic heart disease of native coronary artery without angina pectoris: Secondary | ICD-10-CM

## 2024-07-21 MED ORDER — AMLODIPINE BESYLATE 5 MG PO TABS
5.0000 mg | ORAL_TABLET | Freq: Every day | ORAL | 0 refills | Status: DC
  Filled 2024-07-21 (×2): qty 90, 90d supply, fill #0

## 2024-07-21 MED ORDER — NEXLIZET 180-10 MG PO TABS
1.0000 | ORAL_TABLET | Freq: Every day | ORAL | 3 refills | Status: AC
  Filled 2024-07-21 (×3): qty 90, 90d supply, fill #0
  Filled 2024-10-14: qty 90, 90d supply, fill #1

## 2024-07-21 MED ORDER — METOPROLOL SUCCINATE ER 50 MG PO TB24
75.0000 mg | ORAL_TABLET | Freq: Every day | ORAL | 3 refills | Status: AC
  Filled 2024-07-21 (×2): qty 135, 90d supply, fill #0
  Filled 2024-10-12: qty 135, 90d supply, fill #1

## 2024-07-21 NOTE — Progress Notes (Signed)
 Cardiology Office Note:  .   Date:  07/21/2024  ID:  Christopher Castillo, DOB 05/18/1951, MRN [id number] PCP: Vernadine Charlie ORN, MD  [facility] Providers Cardiologist:  Oneil Parchment, MD     History of Present Illness: .   Christopher Castillo is a 73 y.o. male Discussed the use of AI scribe software  History of Present Illness Christopher Castillo is a 73 year old male with hypertension and hyperlipidemia who presents for cardiovascular follow-up.  I saw his wife yesterday.  He has a history of hypertension, managed with amlodipine  5 mg daily, metoprolol  succinate 75 mg daily, and olmesartan 40 mg daily. Blood pressure readings are consistently higher in the office compared to at home, where he is typically around 120/80 mmHg. He monitors his blood pressure at home in a relaxed setting.  He has hyperlipidemia and is statin intolerant. Currently, he takes a combination of bempedoic acid and ezetimibe  at a dose of 180/10 mg daily. His LDL was 56 mg/dL as of January 14, 2024.  He has a history of mild sleep apnea and experiences increased fatigue, which he attributes to aging. He remains active but notes quicker onset of fatigue.  He has a family history of premature heart disease and reports easy bruising. A CT scan of the chest in 2023 showed scattered coronary calcification but no aneurysm or dissection. An echocardiogram in 2024 showed an ejection fraction of 60-65% with grade one diastolic dysfunction. His abdominal aortic ectasia is stable at 2.6 cm.      Studies Reviewed: SABRA   EKG Interpretation Date/Time:  Wednesday July 21 2024 09:02:33 EST Ventricular Rate:  94 PR Interval:  158 QRS Duration:  78 QT Interval:  358 QTC Calculation: 447 R Axis:   -19  Text Interpretation: Normal sinus rhythm Cannot rule out Anterior infarct , age undetermined When compared with ECG of 15-Nov-2021 01:22, No significant change since last tracing Confirmed by Parchment Oneil ([id number]) on 07/21/2024 9:07:00 AM     Results LABS Creatinine: 1.06 LDL: 56 (01/14/2024)  RADIOLOGY Chest CT: Scattered coronary calcification, no aneurysm, no dissection (2023) Abdominal aortic measurement: 2.6 cm, stable  DIAGNOSTIC Echocardiogram: EF 60-65%, grade 1 diastolic dysfunction (2024) EKG: Normal Risk Assessment/Calculations:       STOP-Bang Score:         Physical Exam:   VS:  BP (!) 152/89 (BP Location: Right Arm, Patient Position: Sitting, Cuff Size: Normal)   Pulse 97   Ht 5' 5 (1.651 m)   Wt 174 lb 6 oz (79.1 kg)   BMI 29.02 kg/m    Wt Readings from Last 3 Encounters:  07/21/24 174 lb 6 oz (79.1 kg)  06/11/23 174 lb 6.4 oz (79.1 kg)  06/05/23 175 lb 14.8 oz (79.8 kg)    GEN: Well nourished, well developed in no acute distress NECK: No JVD; No carotid bruits CARDIAC: RRR, no murmurs, no rubs, no gallops RESPIRATORY:  Clear to auscultation without rales, wheezing or rhonchi  ABDOMEN: Soft, non-tender, non-distended EXTREMITIES:  No edema; No deformity   ASSESSMENT AND PLAN: .    Assessment and Plan Assessment & Plan Atherosclerotic heart disease of native coronary artery Coronary calcifications noted on CT scan. No aneurysm or dissection. LDL levels well-controlled with current medication regimen. No chest pain reported during exertion. - Monitor for any new symptoms such as chest pain during exertion.  Essential hypertension Blood pressure slightly elevated in office but well-controlled at home with readings in the 120s/80s. Current medication regimen appears  effective. - Continue current antihypertensive medications. - Encouraged home blood pressure monitoring.  Hyperlipidemia with statin intolerance Statin intolerance managed with bempedoic acid and ezetimibe  combination. LDL levels well-controlled at 56 mg/dL as of April 7974. - Continue bempedoic acid and ezetimibe  combination therapy.  Stable abdominal aortic ectasia Abdominal aortic ectasia measured at 2.6 cm, considered  upper normal. No signs of aneurysmal expansion.         Dispo: 1 yr  Signed, Oneil Parchment, MD

## 2024-07-21 NOTE — Patient Instructions (Addendum)
 Medication Instructions:  Your physician recommends that you continue on your current medications as directed. Please refer to the Current Medication list given to you today.  *If you need a refill on your cardiac medications before your next appointment, please call your pharmacy*  Lab Work: None ordered.  You may go to any [facility] Location for your lab work:  [facility] - [address] ([facility]) - [address] [phone] [address]  [place] - [address]   [place]  - [address]   [place] - [address] - [address] [employer] [place]  - [address] [place] - [address] ([facility]   If you have labs (blood work) drawn today and your tests are completely normal, you will receive your results only by: [employer] (if you have MyChart)  If you have any lab test that is abnormal or we need to change your treatment, we will call you or send a MyChart message to review the results.  Testing/Procedures: None ordered.  Follow-Up: At [facility], you and your health needs are our priority.  As part of our continuing mission to provide you with exceptional heart care, we have created designated Provider Care Teams.  These Care Teams include your primary Cardiologist (physician) and Advanced Practice Providers (APPs -  Physician Assistants and Nurse Practitioners) who all work together to provide you with the care you need, when you need it.  We recommend signing up for the patient portal called MyChart.  Sign up information is provided on this After Visit Summary.  MyChart is used to connect with patients for Virtual Visits (Telemedicine).  Patients are able to view lab/test results, encounter notes, upcoming appointments, etc.  Non-urgent messages can be sent to your provider as well.   To learn more about what you can do with MyChart, go to  [email or web address].    Your next appointment:   1 year(s)  The format for your next appointment:   In Person  Provider:   Oneil Parchment, MD

## 2024-07-22 ENCOUNTER — Other Ambulatory Visit (HOSPITAL_COMMUNITY): Payer: Self-pay

## 2024-07-22 MED ORDER — CHLORTHALIDONE 25 MG PO TABS
25.0000 mg | ORAL_TABLET | Freq: Every day | ORAL | 3 refills | Status: AC
  Filled 2024-07-22: qty 90, 90d supply, fill #0
  Filled 2024-10-14: qty 90, 90d supply, fill #1

## 2024-07-22 NOTE — Addendum Note (Signed)
 Addended by: MEMORY DELON POUR on: 07/22/2024 09:08 AM   Modules accepted: Orders

## 2024-07-23 ENCOUNTER — Other Ambulatory Visit (HOSPITAL_COMMUNITY): Payer: Self-pay

## 2024-10-12 ENCOUNTER — Other Ambulatory Visit: Payer: Self-pay | Admitting: Cardiology

## 2024-10-12 ENCOUNTER — Other Ambulatory Visit (HOSPITAL_COMMUNITY): Payer: Self-pay

## 2024-10-12 ENCOUNTER — Other Ambulatory Visit: Payer: Self-pay

## 2024-10-12 MED ORDER — AMLODIPINE BESYLATE 5 MG PO TABS
5.0000 mg | ORAL_TABLET | Freq: Every day | ORAL | 0 refills | Status: AC
  Filled 2024-10-12: qty 90, 90d supply, fill #0
